# Patient Record
Sex: Female | Born: 1980 | Race: White | Hispanic: No | Smoking: Never smoker
Health system: Southern US, Community
[De-identification: ages and names within clinical notes are randomized; demographics above are authoritative.]

## PROBLEM LIST (undated history)

## (undated) DIAGNOSIS — T7840XA Allergy, unspecified, initial encounter: Secondary | ICD-10-CM

## (undated) DIAGNOSIS — A6000 Herpesviral infection of urogenital system, unspecified: Secondary | ICD-10-CM

## (undated) DIAGNOSIS — F909 Attention-deficit hyperactivity disorder, unspecified type: Secondary | ICD-10-CM

## (undated) HISTORY — DX: Attention-deficit hyperactivity disorder, unspecified type: F90.9

## (undated) HISTORY — DX: Herpesviral infection of urogenital system, unspecified: A60.00

## (undated) HISTORY — DX: Allergy, unspecified, initial encounter: T78.40XA

---

## 2001-07-06 ENCOUNTER — Other Ambulatory Visit: Admission: RE | Admit: 2001-07-06 | Discharge: 2001-07-06 | Payer: Self-pay | Admitting: Obstetrics and Gynecology

## 2004-12-17 ENCOUNTER — Other Ambulatory Visit: Admission: RE | Admit: 2004-12-17 | Discharge: 2004-12-17 | Payer: Self-pay | Admitting: Obstetrics and Gynecology

## 2007-05-28 DIAGNOSIS — A6000 Herpesviral infection of urogenital system, unspecified: Secondary | ICD-10-CM

## 2007-05-28 HISTORY — DX: Herpesviral infection of urogenital system, unspecified: A60.00

## 2009-07-13 ENCOUNTER — Emergency Department (HOSPITAL_BASED_OUTPATIENT_CLINIC_OR_DEPARTMENT_OTHER): Admission: EM | Admit: 2009-07-13 | Discharge: 2009-07-13 | Payer: Self-pay | Admitting: Emergency Medicine

## 2009-07-15 ENCOUNTER — Inpatient Hospital Stay (HOSPITAL_COMMUNITY): Admission: AD | Admit: 2009-07-15 | Discharge: 2009-07-15 | Payer: Self-pay | Admitting: Family Medicine

## 2009-07-21 ENCOUNTER — Inpatient Hospital Stay (HOSPITAL_COMMUNITY): Admission: AD | Admit: 2009-07-21 | Discharge: 2009-07-21 | Payer: Self-pay | Admitting: Obstetrics & Gynecology

## 2009-07-21 ENCOUNTER — Ambulatory Visit: Payer: Self-pay | Admitting: Family Medicine

## 2009-07-23 ENCOUNTER — Emergency Department (HOSPITAL_BASED_OUTPATIENT_CLINIC_OR_DEPARTMENT_OTHER): Admission: EM | Admit: 2009-07-23 | Discharge: 2009-07-23 | Payer: Self-pay | Admitting: Emergency Medicine

## 2009-07-23 ENCOUNTER — Ambulatory Visit: Payer: Self-pay | Admitting: Diagnostic Radiology

## 2010-05-10 ENCOUNTER — Emergency Department (HOSPITAL_BASED_OUTPATIENT_CLINIC_OR_DEPARTMENT_OTHER)
Admission: EM | Admit: 2010-05-10 | Discharge: 2010-05-11 | Payer: Self-pay | Source: Home / Self Care | Admitting: Emergency Medicine

## 2010-08-16 LAB — DIFFERENTIAL
Basophils Relative: 0 % (ref 0–1)
Eosinophils Relative: 0 % (ref 0–5)
Lymphocytes Relative: 16 % (ref 12–46)
Lymphs Abs: 1.4 10*3/uL (ref 0.7–4.0)
Monocytes Absolute: 0.1 10*3/uL (ref 0.1–1.0)
Monocytes Relative: 2 % — ABNORMAL LOW (ref 3–12)
Neutro Abs: 7.5 10*3/uL (ref 1.7–7.7)
Neutrophils Relative %: 83 % — ABNORMAL HIGH (ref 43–77)

## 2010-08-16 LAB — WET PREP, GENITAL

## 2010-08-16 LAB — URINALYSIS, ROUTINE W REFLEX MICROSCOPIC
Bilirubin Urine: NEGATIVE
Glucose, UA: NEGATIVE mg/dL
Glucose, UA: NEGATIVE mg/dL
Hgb urine dipstick: NEGATIVE
Nitrite: NEGATIVE
Specific Gravity, Urine: 1.02 (ref 1.005–1.030)
Specific Gravity, Urine: 1.021 (ref 1.005–1.030)
Urobilinogen, UA: 0.2 mg/dL (ref 0.0–1.0)
Urobilinogen, UA: 0.2 mg/dL (ref 0.0–1.0)
pH: 5.5 (ref 5.0–8.0)
pH: 6 (ref 5.0–8.0)

## 2010-08-16 LAB — CBC
MCHC: 33.1 g/dL (ref 30.0–36.0)
Platelets: 193 10*3/uL (ref 150–400)
RBC: 4.65 MIL/uL (ref 3.87–5.11)
WBC: 9.1 10*3/uL (ref 4.0–10.5)

## 2010-08-16 LAB — GC/CHLAMYDIA PROBE AMP, GENITAL: Chlamydia, DNA Probe: NEGATIVE

## 2010-08-16 LAB — PREGNANCY, URINE: Preg Test, Ur: NEGATIVE

## 2011-04-21 IMAGING — US US PELVIS COMPLETE MODIFY
1 series · 14 of 25 positions shown · non-contrast
Comparison: None.

CLINICAL DATA: Left lower quadrant pelvic pain.

TRANSABDOMINAL AND TRANSVAGINAL ULTRASOUND OF PELVIS
TECHNIQUE: Both transabdominal and transvaginal ultrasound
examinations of the pelvis were performed including evaluation of
the uterus, ovaries, adnexal regions, and pelvic cul-de-sac.

[Series 1: us pelvis complete modify · 0.17mm/px · 14 of 51 slices shown]
[im 1/51]
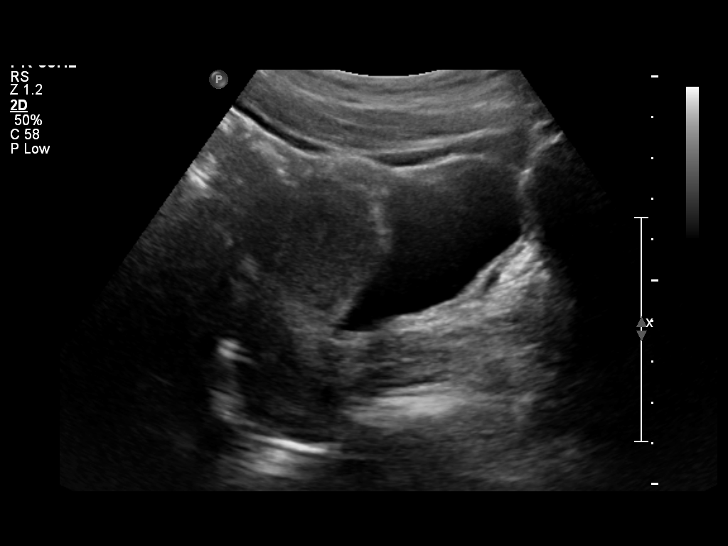
[im 5/51]
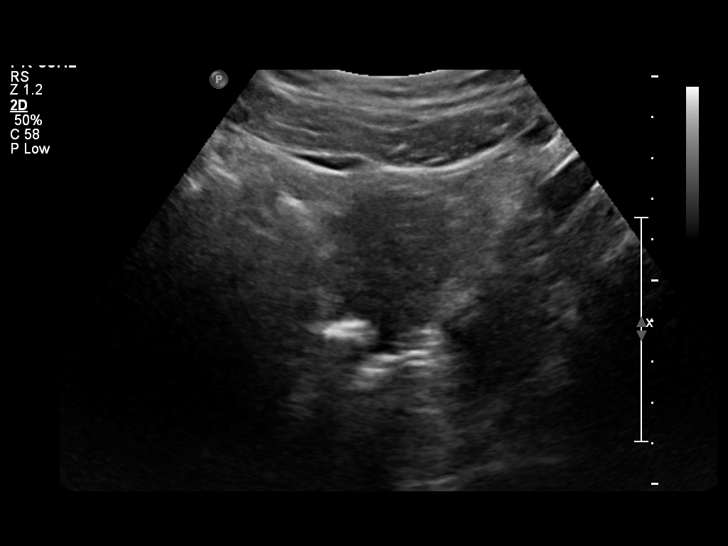
[im 9/51]
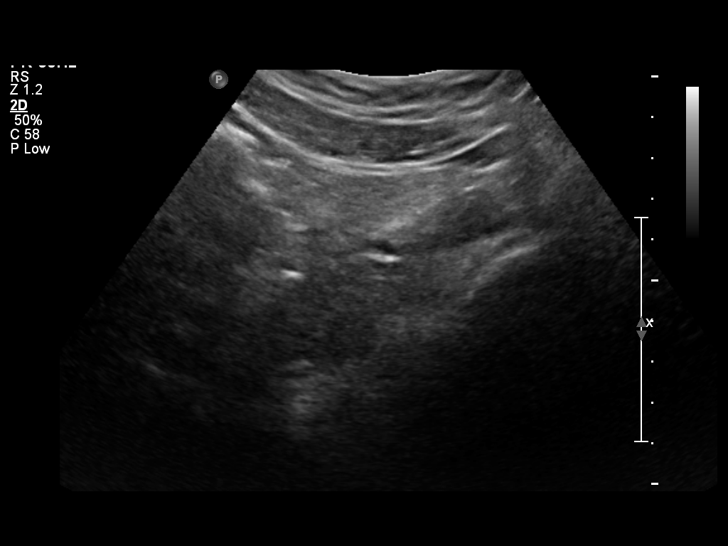
[im 13/51]
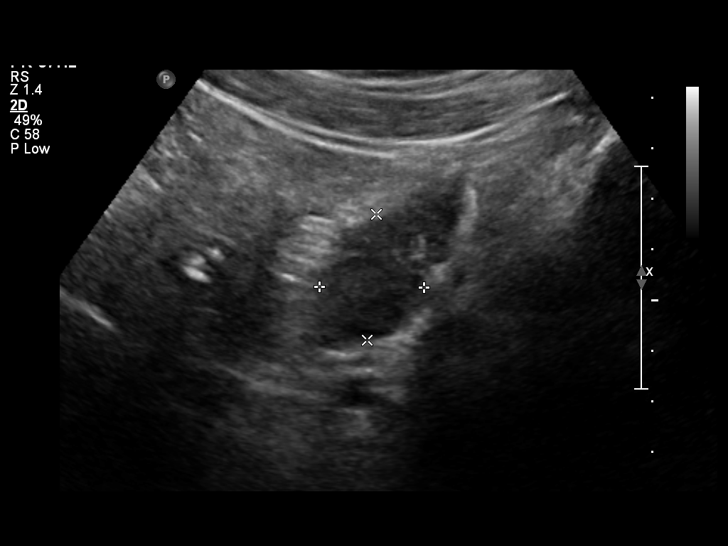
[im 17/51]
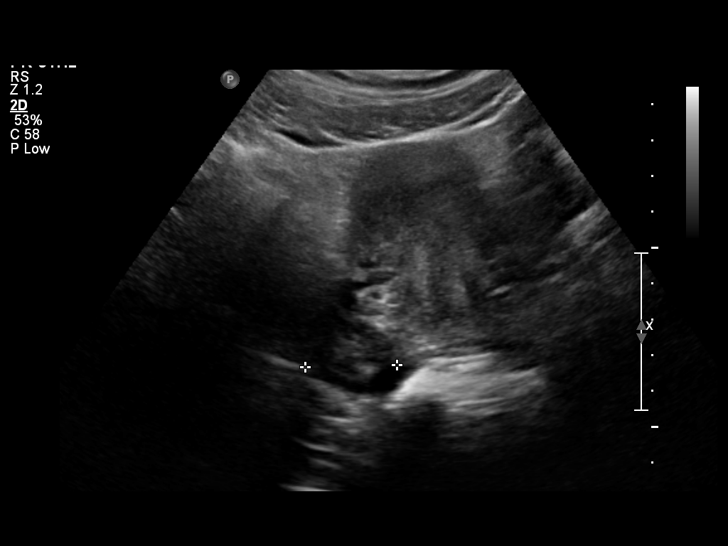
[im 19/51]
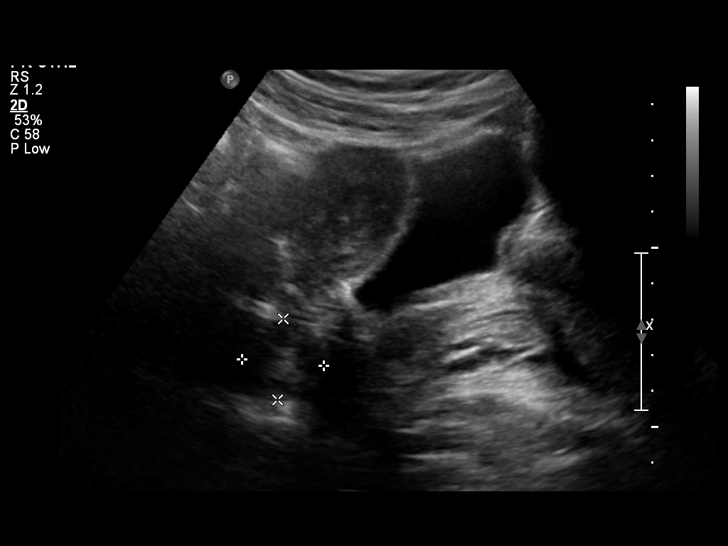
[im 23/51]
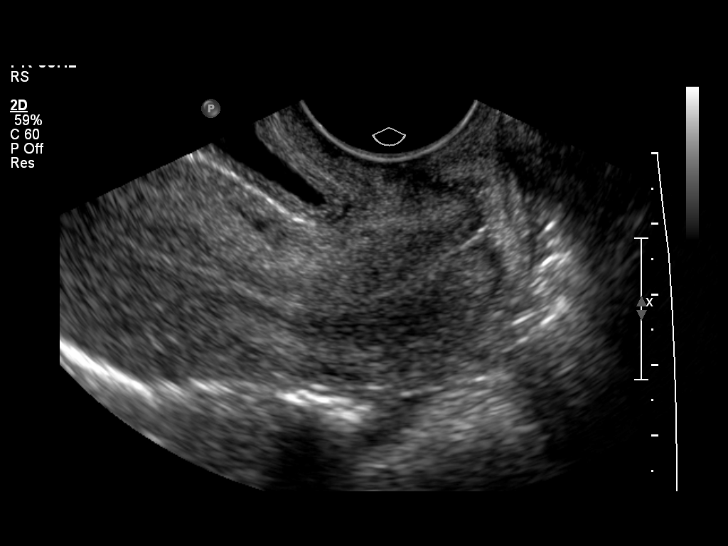
[im 28/51]
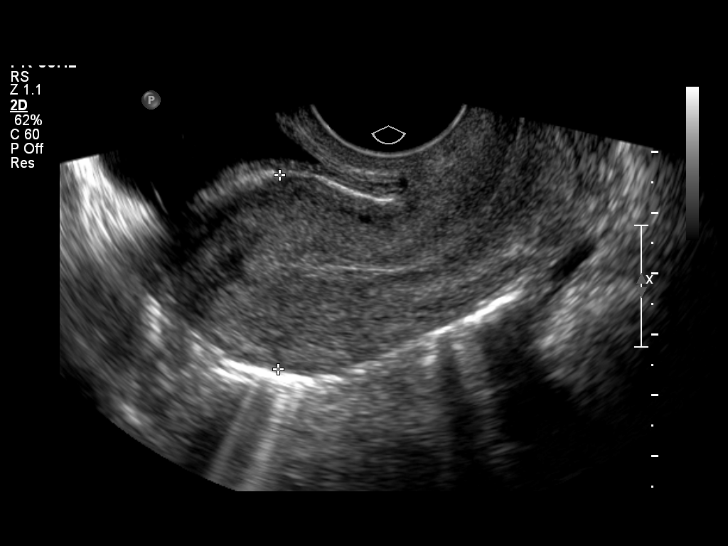
[im 32/51]
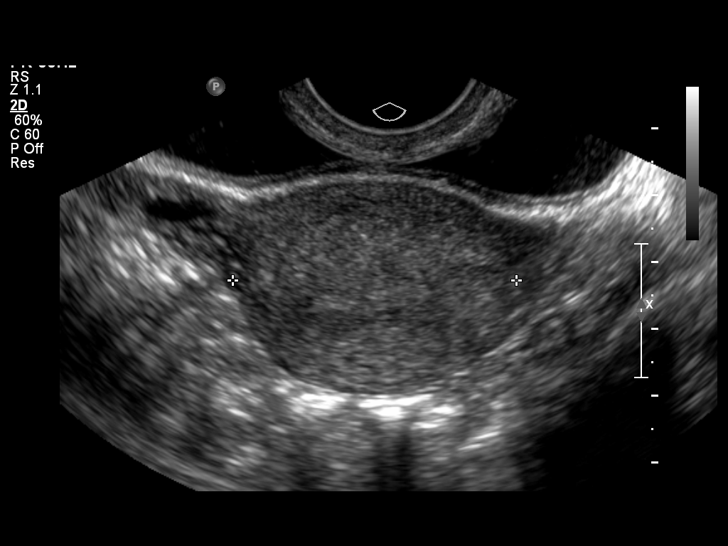
[im 34/51]
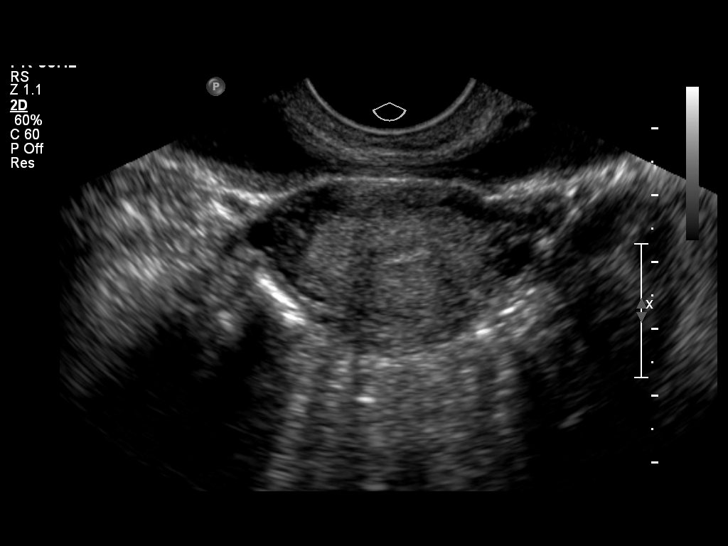
[im 38/51]
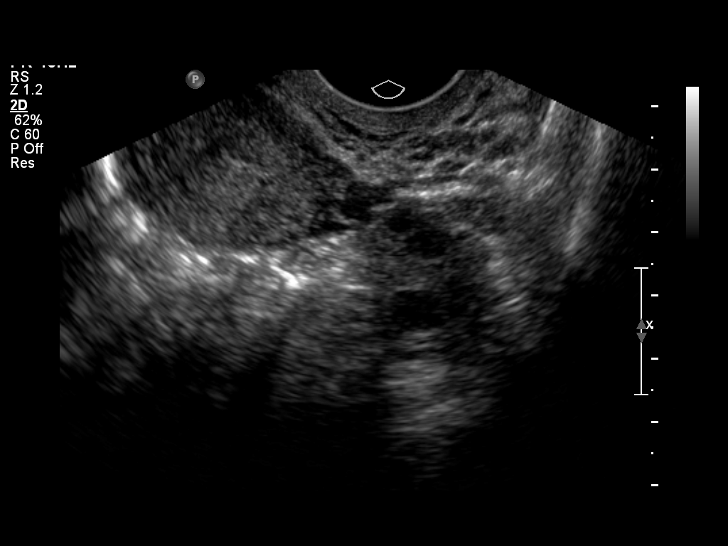
[im 42/51]
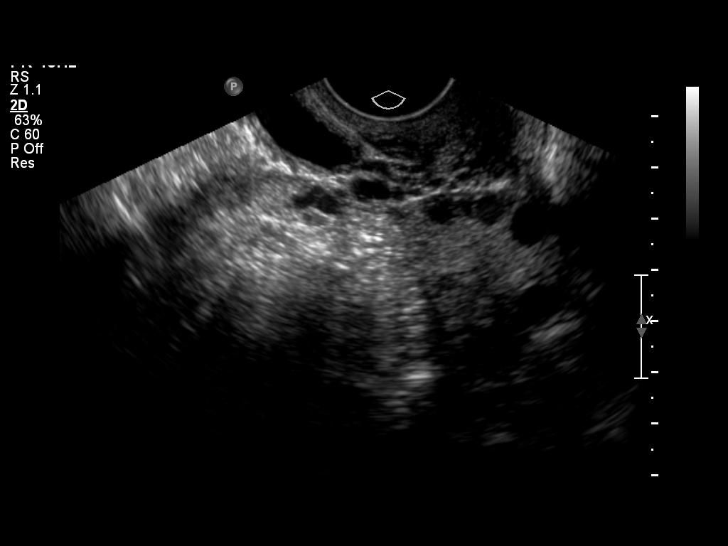
[im 46/51]
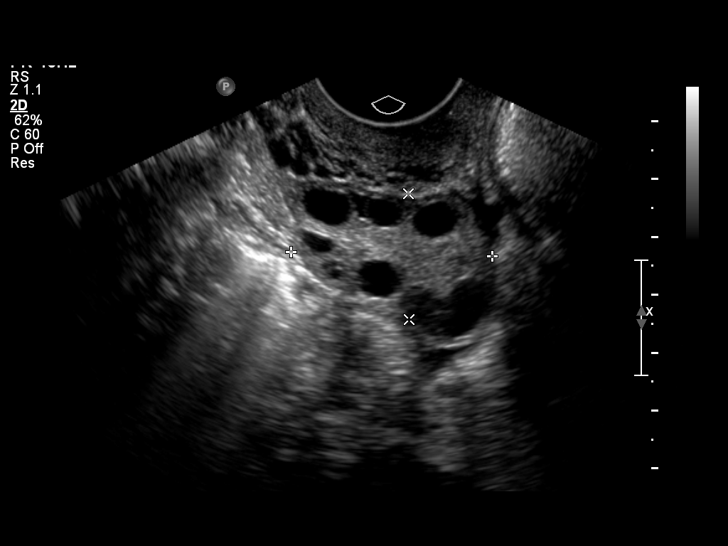
[im 51/51]
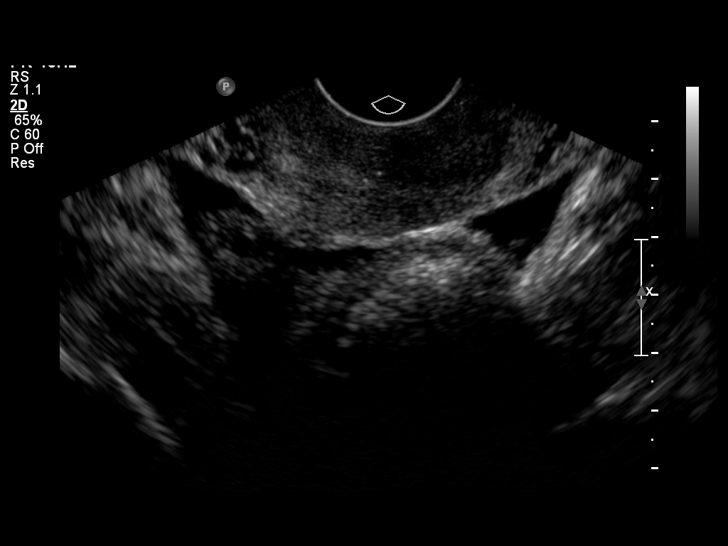

[14 of 25 positions shown; findings below may reference images not displayed]

FINDINGS: Uterus measures 7.2 x 4.2 x 3.2 cm.  Anteverted, anteflexed.
Unremarkable.

Endometrium 3 mm, uniformly echogenic.

Right Ovary measures 3.5 x 3.2 x 2.2 cm.  Unremarkable.

Left Ovary measures 3.5 x 2.4 x 1.9 cm.  Unremarkable.

Other Findings:  A small amount of free fluid is noted in the cul-
de-sac.
IMPRESSION: Normal exam.

## 2011-07-07 ENCOUNTER — Encounter (HOSPITAL_BASED_OUTPATIENT_CLINIC_OR_DEPARTMENT_OTHER): Payer: Self-pay | Admitting: Emergency Medicine

## 2011-07-07 ENCOUNTER — Emergency Department (HOSPITAL_BASED_OUTPATIENT_CLINIC_OR_DEPARTMENT_OTHER)
Admission: EM | Admit: 2011-07-07 | Discharge: 2011-07-07 | Disposition: A | Discharge status: Home / Self Care | Payer: BC Managed Care – PPO | Attending: Emergency Medicine | Admitting: Emergency Medicine

## 2011-07-07 DIAGNOSIS — R300 Dysuria: Secondary | ICD-10-CM | POA: Insufficient documentation

## 2011-07-07 DIAGNOSIS — N390 Urinary tract infection, site not specified: Secondary | ICD-10-CM

## 2011-07-07 DIAGNOSIS — A6000 Herpesviral infection of urogenital system, unspecified: Secondary | ICD-10-CM | POA: Insufficient documentation

## 2011-07-07 LAB — URINALYSIS, ROUTINE W REFLEX MICROSCOPIC
Bilirubin Urine: NEGATIVE
Glucose, UA: NEGATIVE mg/dL
Nitrite: NEGATIVE
Specific Gravity, Urine: 1.019 (ref 1.005–1.030)
pH: 6 (ref 5.0–8.0)

## 2011-07-07 LAB — PREGNANCY, URINE: Preg Test, Ur: NEGATIVE

## 2011-07-07 MED ORDER — VALACYCLOVIR HCL 500 MG PO TABS: 500.0000 mg | ORAL_TABLET | Freq: Two times a day (BID) | ORAL | Status: AC

## 2011-07-07 MED ORDER — PHENAZOPYRIDINE HCL 200 MG PO TABS: 200.0000 mg | ORAL_TABLET | Freq: Three times a day (TID) | ORAL | Status: AC

## 2011-07-07 MED ORDER — NITROFURANTOIN MONOHYD MACRO 100 MG PO CAPS: 100.0000 mg | ORAL_CAPSULE | Freq: Two times a day (BID) | ORAL | Status: AC

## 2011-07-07 MED ORDER — FLUCONAZOLE 150 MG PO TABS: 150.0000 mg | ORAL_TABLET | Freq: Once | ORAL | Status: AC

## 2011-07-07 NOTE — ED Notes (Signed)
Pt c/o dysuria that started today; had RT lower back pain 2-3 days ago, which has resolved

## 2011-07-07 NOTE — ED Provider Notes (Signed)
History     CSN: [id number]  Arrival date & time 07/07/11  0845   First MD Initiated Contact with Patient 07/07/11 1020      Chief Complaint  Patient presents with  . Dysuria    (Consider location/radiation/quality/duration/timing/severity/associated sxs/prior treatment) Patient is a 31 y.o. female presenting with dysuria. The history is provided by the patient.  Dysuria   She noticed onset this morning of dysuria along with urinary urgency, frequency, and tenesmus. She denies abdominal pain or back pain. She denies nausea, vomiting, diarrhea. She denies fever, chills, sweats. 3 days ago, she had pain in the right flank which has resolved. She also, incidentally, has noted a few blisters typical of a flare up of her genital herpes. She has had urinary infections in the past. Nothing is making current symptoms better nothing is making them worse. Symptoms are severe.  History reviewed. No pertinent past medical history.  History reviewed. No pertinent past surgical history.  No family history on file.  History  Substance Use Topics  . Smoking status: Never Smoker   . Smokeless tobacco: Not on file  . Alcohol Use: No    OB History    Grav Para Term Preterm Abortions TAB SAB Ect Mult Living                  Review of Systems  Genitourinary: Positive for dysuria.  All other systems reviewed and are negative.    Allergies  Review of patient's allergies indicates no known allergies.  Home Medications  No current outpatient prescriptions on file.  BP 113/66  Pulse 84  Temp(Src) 98 F (36.7 C) (Oral)  Resp 16  Ht 5\' 5"  (1.651 m)  Wt 143 lb (64.864 kg)  BMI 23.80 kg/m2  SpO2 99%  LMP 06/12/2011  Physical Exam  Nursing note and vitals reviewed.  31 year old female who is resting comfortably and in no acute distress. Vital signs are normal. Head is normocephalic and atraumatic. PERRLA, EOMI. Oropharynx is clear. Neck is nontender and supple. Back is nontender.  There's no CVA tenderness. Lungs are clear without rales, wheezes, or rhonchi. Heart has regular rate and rhythm without murmur. Abdomen is soft, flat, nontender without masses or hepatosplenomegaly. Extremities have no cyanosis or edema. Neurologic: Mental status is normal, cranial nerves are intact, there no focal motor or sensory deficits.  ED Course  Procedures (including critical care time)  Results for orders placed during the hospital encounter of 07/07/11  PREGNANCY, URINE      Component Value Range   Preg Test, Ur NEGATIVE  NEGATIVE   URINALYSIS, ROUTINE W REFLEX MICROSCOPIC      Component Value Range   Color, Urine YELLOW  YELLOW    APPearance TURBID (*) CLEAR    Specific Gravity, Urine 1.019  1.005 - 1.030    pH 6.0  5.0 - 8.0    Glucose, UA NEGATIVE  NEGATIVE (mg/dL)   Hgb urine dipstick LARGE (*) NEGATIVE    Bilirubin Urine NEGATIVE  NEGATIVE    Ketones, ur NEGATIVE  NEGATIVE (mg/dL)   Protein, ur 30 (*) NEGATIVE (mg/dL)   Urobilinogen, UA 0.2  0.0 - 1.0 (mg/dL)   Nitrite NEGATIVE  NEGATIVE    Leukocytes, UA LARGE (*) NEGATIVE   URINE MICROSCOPIC-ADD ON      Component Value Range   Squamous Epithelial / LPF FEW (*) RARE    WBC, UA TOO NUMEROUS TO COUNT  <3 (WBC/hpf)   RBC / HPF 21-50  <3 (RBC/hpf)  Bacteria, UA MANY (*) RARE    Urine-Other RARE YEAST     No results found.    1. Urinary tract infection   2. Genital herpes       MDM  Clinical episode of acute cystitis. I am concerned about the yeast that are present in the urine, however these are likely related to vaginal contamination. She does not had any recent antibiotics. She does give a history of prior vaginal yeast infections. She will be discharged with prescriptions for Macrobid, Diflucan, perineum, and Valtrex.        Dione Booze, MD 07/07/11 1043

## 2011-07-09 LAB — URINE CULTURE

## 2011-07-10 NOTE — ED Notes (Signed)
Treated per protocol; Sensitive to same °

## 2012-04-09 ENCOUNTER — Ambulatory Visit (INDEPENDENT_AMBULATORY_CARE_PROVIDER_SITE_OTHER): Payer: BC Managed Care – PPO | Admitting: Family Medicine

## 2012-04-09 ENCOUNTER — Encounter: Payer: Self-pay | Admitting: Family Medicine

## 2012-04-09 VITALS — BP 122/78 | HR 80 | Temp 98.2°F | Ht 66.0 in | Wt 146.0 lb

## 2012-04-09 DIAGNOSIS — Z0000 Encounter for general adult medical examination without abnormal findings: Secondary | ICD-10-CM | POA: Insufficient documentation

## 2012-04-09 DIAGNOSIS — Z23 Encounter for immunization: Secondary | ICD-10-CM

## 2012-04-09 DIAGNOSIS — F909 Attention-deficit hyperactivity disorder, unspecified type: Secondary | ICD-10-CM

## 2012-04-09 MED ORDER — VALACYCLOVIR HCL 1 G PO TABS: 1000.0000 mg | ORAL_TABLET | Freq: Every day | ORAL | Status: DC

## 2012-04-09 MED ORDER — AMPHETAMINE-DEXTROAMPHET ER 10 MG PO CP24: 10.0000 mg | ORAL_CAPSULE | ORAL | Status: DC

## 2012-04-09 NOTE — Progress Notes (Signed)
  Subjective:    Patient ID: Jane Harper, female    DOB: March 09, 1981, 31 y.o.   MRN: [id number]  HPI New to establish.  Parents are Hopp pts.  Previous MD- [facility]  Herpes- would like to start suppressive tx w/ Valtrex  Review of Systems Patient reports no vision/ hearing changes, adenopathy, fever, weight change,  persistant/recurrent hoarseness , swallowing issues, chest pain, palpitations, edema, persistant/recurrent cough, hemoptysis, dyspnea (rest/exertional/paroxysmal nocturnal), gastrointestinal bleeding (melena, rectal bleeding), abdominal pain, significant heartburn, bowel changes, GU symptoms (dysuria, hematuria, incontinence), Gyn symptoms (abnormal  bleeding, pain),  syncope, focal weakness, memory loss, numbness & tingling, hair/nail changes, abnormal bruising or bleeding, anxiety, or depression.     Objective:   Physical Exam General Appearance:    Alert, cooperative, no distress, appears stated age  Head:    Normocephalic, without obvious abnormality, atraumatic  Eyes:    PERRL, conjunctiva/corneas clear, EOM's intact, fundi    benign, both eyes  Ears:    Normal TM's and external ear canals, both ears  Nose:   Nares normal, septum midline, mucosa normal, no drainage    or sinus tenderness  Throat:   Lips, mucosa, and tongue normal; teeth and gums normal  Neck:   Supple, symmetrical, trachea midline, no adenopathy;    Thyroid: no enlargement/tenderness/nodules  Back:     Symmetric, no curvature, ROM normal, no CVA tenderness  Lungs:     Clear to auscultation bilaterally, respirations unlabored  Chest Wall:    No tenderness or deformity   Heart:    Regular rate and rhythm, S1 and S2 normal, no murmur, rub   or gallop  Breast Exam:    Deferred to GYN  Abdomen:     Soft, non-tender, bowel sounds active all four quadrants,    no masses, no organomegaly  Genitalia:    Deferred to GYN  Rectal:    Extremities:   Extremities normal, atraumatic, no cyanosis or edema    Pulses:   2+ and symmetric all extremities  Skin:   Skin color, texture, turgor normal, no rashes or lesions  Lymph nodes:   Cervical, supraclavicular, and axillary nodes normal  Neurologic:   CNII-XII intact, normal strength, sensation and reflexes    throughout          Assessment & Plan:

## 2012-04-09 NOTE — Patient Instructions (Addendum)
Schedule your pap at your convenience We'll notify you of your lab results You'll need to pick up your Adderall monthly- we can adjust the dose based on your level of symptom control Start the Valtrex daily for suppressive therapy Call with any questions or concerns Welcome!  We're glad to have you!

## 2012-04-10 ENCOUNTER — Encounter: Payer: Self-pay | Admitting: *Deleted

## 2012-04-10 LAB — HEPATIC FUNCTION PANEL
ALT: 13 U/L (ref 0–35)
AST: 18 U/L (ref 0–37)
Alkaline Phosphatase: 48 U/L (ref 39–117)
Total Bilirubin: 0.6 mg/dL (ref 0.3–1.2)
Total Protein: 7.1 g/dL (ref 6.0–8.3)

## 2012-04-10 LAB — CBC WITH DIFFERENTIAL/PLATELET
Basophils Absolute: 0 10*3/uL (ref 0.0–0.1)
Eosinophils Absolute: 0.1 10*3/uL (ref 0.0–0.7)
Hemoglobin: 13.2 g/dL (ref 12.0–15.0)
Lymphocytes Relative: 21.3 % (ref 12.0–46.0)
MCV: 86.4 fl (ref 78.0–100.0)
Monocytes Absolute: 0.3 10*3/uL (ref 0.1–1.0)
Monocytes Relative: 3.8 % (ref 3.0–12.0)
Neutro Abs: 6.1 10*3/uL (ref 1.4–7.7)
Neutrophils Relative %: 73.4 % (ref 43.0–77.0)
RBC: 4.68 Mil/uL (ref 3.87–5.11)
RDW: 13.1 % (ref 11.5–14.6)

## 2012-04-10 LAB — LIPID PANEL
LDL Cholesterol: 87 mg/dL (ref 0–99)
Total CHOL/HDL Ratio: 3
VLDL: 15.4 mg/dL (ref 0.0–40.0)

## 2012-04-10 LAB — BASIC METABOLIC PANEL: GFR: 89.44 mL/min (ref >60.00)

## 2012-04-14 LAB — VITAMIN D 1,25 DIHYDROXY
Vitamin D2 1, 25 (OH)2: <8 pg/mL
Vitamin D3 1, 25 (OH)2: 42 pg/mL

## 2012-04-17 ENCOUNTER — Ambulatory Visit: Payer: BC Managed Care – PPO | Admitting: Family Medicine

## 2012-04-19 ENCOUNTER — Encounter: Payer: Self-pay | Admitting: Family Medicine

## 2012-04-19 DIAGNOSIS — F909 Attention-deficit hyperactivity disorder, unspecified type: Secondary | ICD-10-CM | POA: Insufficient documentation

## 2012-04-19 NOTE — Assessment & Plan Note (Signed)
Pt w/ documented hx of ADHD.  Restart meds.  Adjust dose prn.  Pt expressed understanding and is in agreement w/ plan.

## 2012-04-19 NOTE — Assessment & Plan Note (Signed)
Pt's PE WNL.  Will defer GYN exam due to menses.  Pt to schedule at her convenience.  Check labs.  Anticipatory guidance provided.

## 2012-07-02 ENCOUNTER — Telehealth: Payer: Self-pay | Admitting: Family Medicine

## 2012-07-02 ENCOUNTER — Encounter: Payer: Self-pay | Admitting: Family Medicine

## 2012-07-02 ENCOUNTER — Ambulatory Visit (INDEPENDENT_AMBULATORY_CARE_PROVIDER_SITE_OTHER): Payer: BC Managed Care – PPO | Admitting: Family Medicine

## 2012-07-02 VITALS — BP 112/68 | HR 79 | Temp 98.5°F | Wt 149.4 lb

## 2012-07-02 DIAGNOSIS — J9801 Acute bronchospasm: Secondary | ICD-10-CM

## 2012-07-02 DIAGNOSIS — J309 Allergic rhinitis, unspecified: Secondary | ICD-10-CM

## 2012-07-02 DIAGNOSIS — J302 Other seasonal allergic rhinitis: Secondary | ICD-10-CM

## 2012-07-02 MED ORDER — ALBUTEROL SULFATE HFA 108 (90 BASE) MCG/ACT IN AERS: 2.0000 | INHALATION_SPRAY | Freq: Four times a day (QID) | RESPIRATORY_TRACT | Status: DC | PRN

## 2012-07-02 MED ORDER — VALACYCLOVIR HCL 1 G PO TABS: 1000.0000 mg | ORAL_TABLET | Freq: Every day | ORAL | Status: AC

## 2012-07-02 MED ORDER — AMPHETAMINE-DEXTROAMPHET ER 10 MG PO CP24: 10.0000 mg | ORAL_CAPSULE | ORAL | Status: DC

## 2012-07-02 MED ORDER — BECLOMETHASONE DIPROPIONATE 40 MCG/ACT IN AERS: 2.0000 | INHALATION_SPRAY | Freq: Two times a day (BID) | RESPIRATORY_TRACT | Status: DC

## 2012-07-02 NOTE — Progress Notes (Signed)
  Subjective:     Jane Harper is a 32 y.o. female who presents for evaluation of symptoms of a URI. Symptoms include no  fever, post nasal drip, shortness of breath and wheezing. Onset of symptoms was several days ago, and has been gradually worsening since that time. Treatment to date: antihistamines and cough suppressants.  The following portions of the patient's history were reviewed and updated as appropriate: allergies, current medications, past family history, past medical history, past social history, past surgical history and problem list.  Review of Systems Pertinent items are noted in HPI.   Objective:    BP 112/68  Pulse 79  Temp 98.5 F (36.9 C) (Oral)  Wt 149 lb 6.4 oz (67.767 kg)  SpO2 99% General appearance: alert, cooperative, appears stated age and no distress Ears: normal TM's and external ear canals both ears Nose: Nares normal. Septum midline. Mucosa normal. No drainage or sinus tenderness. Throat: lips, mucosa, and tongue normal; teeth and gums normal Neck: no adenopathy, supple, symmetrical, trachea midline and thyroid not enlarged, symmetric, no tenderness/mass/nodules Lungs: diminished breath sounds bilaterally Heart: S1, S2 normal   Assessment:    allergic rhinitis and bronchospasm   Plan:    Discussed diagnosis and treatment of URI. Suggested symptomatic OTC remedies. Follow up as needed.  qvar and proair Antihistamine daily

## 2012-07-02 NOTE — Telephone Encounter (Signed)
Patient Information:  Caller Name: Jane Harper  Phone: [phone]  Patient: Jane, Harper  Gender: Female  DOB: 1980-11-23  Age: 32 Years  PCP: Lelon Perla.  Pregnant: No  Office Follow Up:  Does the office need to follow up with this patient?: No  Instructions For The Office: N/A   Symptoms  Reason For Call & Symptoms: Calla is calling about throat feeling scratchy and feeling short of breath on and off for past 2 days. Clear runny nose. Hx Bronchitis. Afebrile. No cough.  Reviewed Health History In EMR: Yes  Reviewed Medications In EMR: Yes  Reviewed Allergies In EMR: Yes  Reviewed Surgeries / Procedures: Yes  Date of Onset of Symptoms: 07/01/2012  Treatments Tried: Tylenol Cold Medicine  Treatments Tried Worked: No OB / GYN:  LMP: Unknown  Guideline(s) Used:  Breathing Difficulty  Disposition Per Guideline:   Go to Office Now  Reason For Disposition Reached:   Mild difficulty breathing (e.g., minimal/no SOB at rest, SOB with walking, pulse < 100) of new onset or worse than normal  Advice Given:  General Care Advice for Breathing Difficulty:  Find position of greatest comfort. For most patients the best position is semi-upright (e.g., sitting up in a comfortable chair or lying back against pillows).  Elevate head of bed (e.g., use pillows or place blocks under bed).  Avoid smoke or fume exposure. Increase fluids, Take warm fluids, use humidifier  Call Back If:  Severe difficulty breathing occurs  Fever more than 100.5 F (38.1 C)  You become worse.  Appointment Scheduled:  07/02/2012 14:30:00 Appointment Scheduled Provider:  Lelon Perla

## 2012-07-02 NOTE — Telephone Encounter (Signed)
Dr Laury Axon pt, seen in office today.

## 2012-07-02 NOTE — Patient Instructions (Signed)
Allergies, Generic  Allergies may happen from anything your body is sensitive to. This may be food, medicines, pollens, chemicals, and nearly anything around you in everyday life that produces allergens. An allergen is anything that causes an allergy producing substance. Heredity is often a factor in causing these problems. This means you may have some of the same allergies as your parents.  Food allergies happen in all age groups. Food allergies are some of the most severe and life threatening. Some common food allergies are cow's milk, seafood, eggs, nuts, wheat, and soybeans.  SYMPTOMS    Swelling around the mouth.   An itchy red rash or hives.   Vomiting or diarrhea.   Difficulty breathing.  SEVERE ALLERGIC REACTIONS ARE LIFE-THREATENING.  This reaction is called anaphylaxis. It can cause the mouth and throat to swell and cause difficulty with breathing and swallowing. In severe reactions only a trace amount of food (for example, peanut oil in a salad) may cause death within seconds.  Seasonal allergies occur in all age groups. These are seasonal because they usually occur during the same season every year. They may be a reaction to molds, grass pollens, or tree pollens. Other causes of problems are house dust mite allergens, pet dander, and mold spores. The symptoms often consist of nasal congestion, a runny itchy nose associated with sneezing, and tearing itchy eyes. There is often an associated itching of the mouth and ears. The problems happen when you come in contact with pollens and other allergens. Allergens are the particles in the air that the body reacts to with an allergic reaction. This causes you to release allergic antibodies. Through a chain of events, these eventually cause you to release histamine into the blood stream. Although it is meant to be protective to the body, it is this release that causes your discomfort. This is why you were given anti-histamines to feel better. If you are  unable to pinpoint the offending allergen, it may be determined by skin or blood testing. Allergies cannot be cured but can be controlled with medicine.  Hay fever is a collection of all or some of the seasonal allergy problems. It may often be treated with simple over-the-counter medicine such as diphenhydramine. Take medicine as directed. Do not drink alcohol or drive while taking this medicine. Check with your caregiver or package insert for child dosages.  If these medicines are not effective, there are many new medicines your caregiver can prescribe. Stronger medicine such as nasal spray, eye drops, and corticosteroids may be used if the first things you try do not work well. Other treatments such as immunotherapy or desensitizing injections can be used if all else fails. Follow up with your caregiver if problems continue. These seasonal allergies are usually not life threatening. They are generally more of a nuisance that can often be handled using medicine.  HOME CARE INSTRUCTIONS    If unsure what causes a reaction, keep a diary of foods eaten and symptoms that follow. Avoid foods that cause reactions.   If hives or rash are present:   Take medicine as directed.   You may use an over-the-counter antihistamine (diphenhydramine) for hives and itching as needed.   Apply cold compresses (cloths) to the skin or take baths in cool water. Avoid hot baths or showers. Heat will make a rash and itching worse.   If you are severely allergic:   Following a treatment for a severe reaction, hospitalization is often required for closer follow-up.     Wear a medic-alert bracelet or necklace stating the allergy.   You and your family must learn how to give adrenaline or use an anaphylaxis kit.   If you have had a severe reaction, always carry your anaphylaxis kit or EpiPen with you. Use this medicine as directed by your caregiver if a severe reaction is occurring. Failure to do so could have a fatal outcome.  SEEK  MEDICAL CARE IF:   You suspect a food allergy. Symptoms generally happen within 30 minutes of eating a food.   Your symptoms have not gone away within 2 days or are getting worse.   You develop new symptoms.   You want to retest yourself or your child with a food or drink you think causes an allergic reaction. Never do this if an anaphylactic reaction to that food or drink has happened before. Only do this under the care of a caregiver.  SEEK IMMEDIATE MEDICAL CARE IF:    You have difficulty breathing, are wheezing, or have a tight feeling in your chest or throat.   You have a swollen mouth, or you have hives, swelling, or itching all over your body.   You have had a severe reaction that has responded to your anaphylaxis kit or an EpiPen. These reactions may return when the medicine has worn off. These reactions should be considered life threatening.  MAKE SURE YOU:    Understand these instructions.   Will watch your condition.   Will get help right away if you are not doing well or get worse.  Document Released: 08/06/2002 Document Revised: 08/05/2011 Document Reviewed: 01/11/2008  ExitCare Patient Information 2013 [employer].

## 2012-07-03 ENCOUNTER — Encounter: Payer: Self-pay | Admitting: *Deleted

## 2012-07-03 LAB — ALLERGEN FOOD PROFILE SPECIFIC IGE
Chicken IgE: <0.1 kU/L
Egg White IgE: <0.1 kU/L
Fish Cod: <0.1 kU/L
Milk IgE: <0.1 kU/L
Wheat IgE: <0.1 kU/L

## 2012-07-07 ENCOUNTER — Ambulatory Visit: Payer: BC Managed Care – PPO | Admitting: Family Medicine

## 2012-08-10 HISTORY — PX: NEVUS EXCISION: SHX2090

## 2012-09-04 ENCOUNTER — Telehealth: Payer: Self-pay | Admitting: *Deleted

## 2012-09-04 NOTE — Telephone Encounter (Signed)
Attempted to speak with pt concerning allergy testing, lmovm stating she is highly allergic to cats and leaf fungus. Advised to avoid allergens if possible and to use OTC antihistamines prn.

## 2012-12-21 ENCOUNTER — Other Ambulatory Visit (HOSPITAL_COMMUNITY)
Admission: RE | Admit: 2012-12-21 | Discharge: 2012-12-21 | Disposition: A | Discharge status: Home / Self Care | Payer: BC Managed Care – PPO | Source: Ambulatory Visit | Attending: Internal Medicine | Admitting: Internal Medicine

## 2012-12-21 DIAGNOSIS — Z01419 Encounter for gynecological examination (general) (routine) without abnormal findings: Secondary | ICD-10-CM | POA: Insufficient documentation

## 2012-12-21 DIAGNOSIS — Z1151 Encounter for screening for human papillomavirus (HPV): Secondary | ICD-10-CM | POA: Insufficient documentation

## 2013-04-19 ENCOUNTER — Encounter: Payer: Self-pay | Admitting: Emergency Medicine

## 2013-04-19 ENCOUNTER — Ambulatory Visit: Payer: BC Managed Care – PPO | Admitting: Emergency Medicine

## 2013-04-19 VITALS — BP 104/66 | HR 88 | Temp 98.2°F | Resp 18 | Wt 150.0 lb

## 2013-04-19 DIAGNOSIS — N390 Urinary tract infection, site not specified: Secondary | ICD-10-CM

## 2013-04-19 DIAGNOSIS — F988 Other specified behavioral and emotional disorders with onset usually occurring in childhood and adolescence: Secondary | ICD-10-CM

## 2013-04-19 DIAGNOSIS — R109 Unspecified abdominal pain: Secondary | ICD-10-CM

## 2013-04-19 LAB — BASIC METABOLIC PANEL WITH GFR
CO2: 26 mEq/L (ref 19–32)
Calcium: 9.8 mg/dL (ref 8.4–10.5)
Chloride: 103 mEq/L (ref 96–112)
Creat: 0.96 mg/dL (ref 0.50–1.10)
GFR, Est Non African American: 79 mL/min
Glucose, Bld: 65 mg/dL — ABNORMAL LOW (ref 70–99)
Sodium: 139 mEq/L (ref 135–145)

## 2013-04-19 LAB — CBC WITH DIFFERENTIAL/PLATELET
Basophils Absolute: 0 10*3/uL (ref 0.0–0.1)
Basophils Relative: 0 % (ref 0–1)
Eosinophils Relative: 1 % (ref 0–5)
Lymphocytes Relative: 26 % (ref 12–46)
Lymphs Abs: 1.7 10*3/uL (ref 0.7–4.0)
Neutrophils Relative %: 65 % (ref 43–77)
Platelets: 241 10*3/uL (ref 150–400)
RBC: 4.98 MIL/uL (ref 3.87–5.11)
RDW: 13.5 % (ref 11.5–15.5)
WBC: 6.8 10*3/uL (ref 4.0–10.5)

## 2013-04-19 MED ORDER — NITROFURANTOIN MONOHYD MACRO 100 MG PO CAPS: 100.0000 mg | ORAL_CAPSULE | Freq: Two times a day (BID) | ORAL | Status: AC

## 2013-04-19 MED ORDER — AMPHETAMINE-DEXTROAMPHET ER 10 MG PO CP24: 10.0000 mg | ORAL_CAPSULE | ORAL | Status: AC

## 2013-04-19 NOTE — Patient Instructions (Signed)
Kidney Stones °Kidney stones (urolithiasis) are solid masses that form inside your kidneys. The intense pain is caused by the stone moving through the kidney, ureter, bladder, and urethra (urinary tract). When the stone moves, the ureter starts to spasm around the stone. The stone is usually passed in your pee (urine).  °HOME CARE °· Drink enough fluids to keep your pee clear or pale yellow. This helps to get the stone out. °· Strain all pee through the provided strainer. Do not pee without peeing through the strainer, not even once. If you pee the stone out, catch it in the strainer. The stone may be as small as a grain of salt. Take this to your doctor. This will help your doctor figure out what you can do to try to prevent more kidney stones. °· Only take medicine as told by your doctor. °· Follow up with your doctor as told. °· Get follow-up X-rays as told by your doctor. °GET HELP IF: °You have pain that gets worse even if you have been taking pain medicine. °GET HELP RIGHT AWAY IF:  °· Your pain does not get better with medicine. °· You have a fever or shaking chills. °· Your pain increases and gets worse over 18 hours. °· You have new belly (abdominal) pain. °· You feel faint or pass out. °· You are unable to pee. °MAKE SURE YOU:  °· Understand these instructions. °· Will watch your condition. °· Will get help right away if you are not doing well or get worse. °Document Released: 10/30/2007 Document Revised: 01/13/2013 Document Reviewed: 10/14/2012 °[employer]® Patient Information ©2014 [employer]. ° °Urinary Tract Infection °A urinary tract infection (UTI) can occur any place along the urinary tract. The tract includes the kidneys, ureters, bladder, and urethra. A type of germ called bacteria often causes a UTI. UTIs are often helped with antibiotic medicine.  °HOME CARE  °· If given, take antibiotics as told by your doctor. Finish them even if you start to feel better. °· Drink enough fluids to keep your  pee (urine) clear or pale yellow. °· Avoid tea, drinks with caffeine, and bubbly (carbonated) drinks. °· Pee often. Avoid holding your pee in for a long time. °· Pee before and after having sex (intercourse). °· Wipe from front to back after you poop (bowel movement) if you are a woman. Use each tissue only once. °GET HELP RIGHT AWAY IF:  °· You have back pain. °· You have lower belly (abdominal) pain. °· You have chills. °· You feel sick to your stomach (nauseous). °· You throw up (vomit). °· Your burning or discomfort with peeing does not go away. °· You have a fever. °· Your symptoms are not better in 3 days. °MAKE SURE YOU:  °· Understand these instructions. °· Will watch your condition. °· Will get help right away if you are not doing well or get worse. °Document Released: 10/30/2007 Document Revised: 02/05/2012 Document Reviewed: 12/12/2011 °[employer]® Patient Information ©2014 [employer]. ° °

## 2013-04-19 NOTE — Progress Notes (Signed)
  Subjective:    Patient ID: Jane Harper, female    DOB: 1981/02/27, 32 y.o.   MRN: [id number]  HPI Comments: Patient was seen at UC several days ago with + culture for UTI and started on Cipro AD. She initially had improvement with symptoms but has noticed increased left flank pain over last day or so. She has increased her soda intake instead of water, and increased  Sexual activity with spouse. Denies concerns for STDs, discharge, fever or blood. She does not have a history of kidney stones.  Back Pain    Current Outpatient Prescriptions on File Prior to Visit  Medication Sig Dispense Refill  . albuterol (PROAIR HFA) 108 (90 BASE) MCG/ACT inhaler Inhale 2 puffs into the lungs every 6 (six) hours as needed for wheezing.      . beclomethasone (QVAR) 40 MCG/ACT inhaler Inhale 2 puffs into the lungs 2 (two) times daily.  1 Inhaler  12  . valACYclovir (VALTREX) 1000 MG tablet Take 1 tablet (1,000 mg total) by mouth daily.  30 tablet  6   No current facility-administered medications on file prior to visit.  ALSO ON ADDERALL XR 10mg  qd  Review of patient's allergies indicates no known allergies.  Past Medical History  Diagnosis Date  . Herpes genitalia 2009  . ADHD (attention deficit hyperactivity disorder)     Review of Systems  Musculoskeletal: Positive for back pain.  All other systems reviewed and are negative.    BP 104/66  Pulse 88  Temp(Src) 98.2 F (36.8 C) (Temporal)  Resp 18  Wt 150 lb (68.04 kg)  LMP 03/26/2013     Objective:   Physical Exam  Nursing note and vitals reviewed. Constitutional: She appears well-developed and well-nourished.  HENT:  Head: Normocephalic.  Eyes: Pupils are equal, round, and reactive to light.  Neck: Normal range of motion.  Cardiovascular: Normal rate, regular rhythm, normal heart sounds and intact distal pulses.   Pulmonary/Chest: Effort normal and breath sounds normal.  Abdominal: Soft. Bowel sounds are normal. She exhibits no  mass. There is tenderness. There is no rebound and no guarding.  suprapubic  Musculoskeletal: Normal range of motion.  Neurological: She is alert.  Skin: Skin is warm and dry.  Psychiatric: Judgment normal.          Assessment & Plan:  Concern for UTi- add Macrobid AD, Incease H2O, decrease soda. Hygiene advised w/c if symptoms increase. Check labs

## 2013-04-20 LAB — URINALYSIS, ROUTINE W REFLEX MICROSCOPIC
Bilirubin Urine: NEGATIVE
Leukocytes, UA: NEGATIVE
Nitrite: NEGATIVE
Protein, ur: NEGATIVE mg/dL
Specific Gravity, Urine: 1.02 (ref 1.005–1.030)
Urobilinogen, UA: 0.2 mg/dL (ref 0.0–1.0)
pH: 6 (ref 5.0–8.0)

## 2013-04-20 LAB — URINE CULTURE
Colony Count: NO GROWTH
Organism ID, Bacteria: NO GROWTH

## 2013-04-21 ENCOUNTER — Telehealth: Payer: Self-pay | Admitting: *Deleted

## 2013-04-21 NOTE — Telephone Encounter (Signed)
Spoke with patient about recent lab results and instructions. 

## 2013-04-21 NOTE — Telephone Encounter (Signed)
Message copied by Nicholaus Corolla A on Wed Apr 21, 2013 10:03 AM ------      Message from: [place], [place] R      Created: Wed Apr 21, 2013  6:41 AM       No UTI but with symptoms continue antibiotic AD. F/u if no improvement ------

## 2013-04-26 ENCOUNTER — Ambulatory Visit
Admission: RE | Admit: 2013-04-26 | Discharge: 2013-04-26 | Disposition: A | Discharge status: Home / Self Care | Payer: BC Managed Care – PPO | Source: Ambulatory Visit | Attending: Emergency Medicine | Admitting: Emergency Medicine

## 2013-04-26 ENCOUNTER — Other Ambulatory Visit: Payer: Self-pay | Admitting: Emergency Medicine

## 2013-04-26 DIAGNOSIS — R319 Hematuria, unspecified: Secondary | ICD-10-CM

## 2013-04-26 LAB — PREGNANCY, URINE: Preg Test, Ur: NEGATIVE

## 2013-04-26 MED ORDER — CIPROFLOXACIN HCL 500 MG PO TABS: 500.0000 mg | ORAL_TABLET | Freq: Two times a day (BID) | ORAL | Status: DC

## 2013-04-26 MED ORDER — CIPROFLOXACIN HCL 500 MG PO TABS: 500.0000 mg | ORAL_TABLET | Freq: Two times a day (BID) | ORAL | Status: AC

## 2013-04-27 ENCOUNTER — Encounter (HOSPITAL_BASED_OUTPATIENT_CLINIC_OR_DEPARTMENT_OTHER): Payer: Self-pay | Admitting: Emergency Medicine

## 2013-04-27 ENCOUNTER — Emergency Department (HOSPITAL_BASED_OUTPATIENT_CLINIC_OR_DEPARTMENT_OTHER)
Admission: EM | Admit: 2013-04-27 | Discharge: 2013-04-27 | Disposition: A | Discharge status: Home / Self Care | Payer: BC Managed Care – PPO | Attending: Emergency Medicine | Admitting: Emergency Medicine

## 2013-04-27 ENCOUNTER — Telehealth: Payer: Self-pay | Admitting: *Deleted

## 2013-04-27 DIAGNOSIS — Z792 Long term (current) use of antibiotics: Secondary | ICD-10-CM | POA: Insufficient documentation

## 2013-04-27 DIAGNOSIS — Z3202 Encounter for pregnancy test, result negative: Secondary | ICD-10-CM | POA: Insufficient documentation

## 2013-04-27 DIAGNOSIS — Z79899 Other long term (current) drug therapy: Secondary | ICD-10-CM | POA: Insufficient documentation

## 2013-04-27 DIAGNOSIS — F909 Attention-deficit hyperactivity disorder, unspecified type: Secondary | ICD-10-CM | POA: Insufficient documentation

## 2013-04-27 DIAGNOSIS — IMO0002 Reserved for concepts with insufficient information to code with codable children: Secondary | ICD-10-CM | POA: Insufficient documentation

## 2013-04-27 DIAGNOSIS — Z8619 Personal history of other infectious and parasitic diseases: Secondary | ICD-10-CM | POA: Insufficient documentation

## 2013-04-27 DIAGNOSIS — M549 Dorsalgia, unspecified: Secondary | ICD-10-CM

## 2013-04-27 DIAGNOSIS — Z87440 Personal history of urinary (tract) infections: Secondary | ICD-10-CM | POA: Insufficient documentation

## 2013-04-27 LAB — URINALYSIS, ROUTINE W REFLEX MICROSCOPIC
Glucose, UA: NEGATIVE mg/dL
Ketones, ur: NEGATIVE mg/dL
Leukocytes, UA: NEGATIVE
Nitrite: NEGATIVE
Protein, ur: NEGATIVE mg/dL
Urobilinogen, UA: 0.2 mg/dL (ref 0.0–1.0)
pH: 6 (ref 5.0–8.0)

## 2013-04-27 NOTE — ED Notes (Signed)
pa at bedside. 

## 2013-04-27 NOTE — Telephone Encounter (Signed)
PT aware of ct results & instructions. Also, scheduled a f/u UA in to recheck blood in urine.

## 2013-04-27 NOTE — ED Provider Notes (Signed)
CSN: [id number]     Arrival date & time 04/27/13  1749 History   First MD Initiated Contact with Patient 04/27/13 1816     Chief Complaint  Patient presents with  . Flank Pain   (Consider location/radiation/quality/duration/timing/severity/associated sxs/prior Treatment) Patient is a 32 y.o. female presenting with flank pain. The history is provided by the patient. No language interpreter was used.  Flank Pain This is a new problem. The current episode started 1 to 4 weeks ago. The problem occurs constantly. The problem has been unchanged. Associated symptoms comments: Left flank . Nothing aggravates the symptoms. She has tried nothing for the symptoms. The treatment provided mild relief.   Pt has had flank pain  Past Medical History  Diagnosis Date  . Herpes genitalia 2009  . ADHD (attention deficit hyperactivity disorder)    History reviewed. No pertinent past surgical history. Family History  Problem Relation Age of Onset  . Hypertension Mother   . Hypertension Father   . Hypertension Maternal Grandmother   . Diabetes Maternal Uncle   . Heart attack Maternal Grandfather   . Lung cancer Maternal Grandfather    History  Substance Use Topics  . Smoking status: Never Smoker   . Smokeless tobacco: Not on file  . Alcohol Use: No   OB History   Grav Para Term Preterm Abortions TAB SAB Ect Mult Living                 Review of Systems  Genitourinary: Positive for flank pain.  All other systems reviewed and are negative.    Allergies  Review of patient's allergies indicates no known allergies.  Home Medications   Current Outpatient Rx  Name  Route  Sig  Dispense  Refill  . albuterol (PROAIR HFA) 108 (90 BASE) MCG/ACT inhaler   Inhalation   Inhale 2 puffs into the lungs every 6 (six) hours as needed for wheezing.         Marland Kitchen amphetamine-dextroamphetamine (ADDERALL XR) 10 MG 24 hr capsule   Oral   Take 1 capsule (10 mg total) by mouth every morning.   30 capsule  0   . beclomethasone (QVAR) 40 MCG/ACT inhaler   Inhalation   Inhale 2 puffs into the lungs 2 (two) times daily.   1 Inhaler   12   . ciprofloxacin (CIPRO) 500 MG tablet   Oral   Take 1 tablet (500 mg total) by mouth 2 (two) times daily.   14 tablet   0   . valACYclovir (VALTREX) 1000 MG tablet   Oral   Take 1 tablet (1,000 mg total) by mouth daily.   30 tablet   6    BP 114/72  Pulse 78  Temp(Src) 98.6 F (37 C) (Oral)  Resp 16  Ht 5\' 5"  (1.651 m)  Wt 147 lb (66.679 kg)  BMI 24.46 kg/m2  SpO2 96%  LMP 03/26/2013 Physical Exam  Nursing note and vitals reviewed. Constitutional: She appears well-developed and well-nourished.  HENT:  Head: Normocephalic.  Right Ear: External ear normal.  Left Ear: External ear normal.  Nose: Nose normal.  Mouth/Throat: Oropharynx is clear and moist.  Eyes: Conjunctivae are normal. Pupils are equal, round, and reactive to light.  Neck: Normal range of motion.  Cardiovascular: Normal rate.   Pulmonary/Chest: Effort normal.  Abdominal: Soft.  Musculoskeletal: Normal range of motion.  Neurological: She is alert.  Skin: Skin is warm.  Psychiatric: She has a normal mood and affect.    ED  Course  Procedures (including critical care time) Labs Review Labs Reviewed  URINALYSIS, ROUTINE W REFLEX MICROSCOPIC  PREGNANCY, URINE   Imaging Review Ct Abdomen Pelvis Wo Contrast  04/26/2013   CLINICAL DATA:  Left lower quadrant pain.  Hematuria.  EXAM: CT ABDOMEN AND PELVIS WITHOUT CONTRAST  TECHNIQUE: Multidetector CT imaging of the abdomen and pelvis was performed following the standard protocol without intravenous contrast.  COMPARISON:  None.  FINDINGS: Lung bases are clear.  No pericardial fluid.  No focal hepatic lesions on this non contrast exam. The gallbladder, pancreas, spleen, and adrenal glands are normal. There is no nephrolithiasis or ureterolithiasis. No obstructive uropathy evident. There is a rounded 3 mm calcification in the  left pelvis which is felt to represent vascular calcification (image 71).  The stomach, small bowel, appendix, cecum normal. The colon and rectosigmoid colon are normal.  Smaller free fluid in the right pelvis. Uterus and ovaries are grossly normal on this noncontrast exam. No aggressive osseous lesion.  IMPRESSION: 1. No nephrolithiasis or obstructive uropathy. 2. No clear evidence of ureterolithiasis. 3. Normal appendix. 4. Small amount of free fluid in the right pelvis is likely physiologic.   Electronically Signed   By: Genevive Bi M.D.   On: 04/26/2013 17:06    EKG Interpretation   None      Results for orders placed during the hospital encounter of 04/27/13  URINALYSIS, ROUTINE W REFLEX MICROSCOPIC      Result Value Range   Color, Urine YELLOW  YELLOW   APPearance CLEAR  CLEAR   Specific Gravity, Urine 1.016  1.005 - 1.030   pH 6.0  5.0 - 8.0   Glucose, UA NEGATIVE  NEGATIVE mg/dL   Hgb urine dipstick NEGATIVE  NEGATIVE   Bilirubin Urine NEGATIVE  NEGATIVE   Ketones, ur NEGATIVE  NEGATIVE mg/dL   Protein, ur NEGATIVE  NEGATIVE mg/dL   Urobilinogen, UA 0.2  0.0 - 1.0 mg/dL   Nitrite NEGATIVE  NEGATIVE   Leukocytes, UA NEGATIVE  NEGATIVE  PREGNANCY, URINE      Result Value Range   Preg Test, Ur NEGATIVE  NEGATIVE   Ct Abdomen Pelvis Wo Contrast  04/26/2013   CLINICAL DATA:  Left lower quadrant pain.  Hematuria.  EXAM: CT ABDOMEN AND PELVIS WITHOUT CONTRAST  TECHNIQUE: Multidetector CT imaging of the abdomen and pelvis was performed following the standard protocol without intravenous contrast.  COMPARISON:  None.  FINDINGS: Lung bases are clear.  No pericardial fluid.  No focal hepatic lesions on this non contrast exam. The gallbladder, pancreas, spleen, and adrenal glands are normal. There is no nephrolithiasis or ureterolithiasis. No obstructive uropathy evident. There is a rounded 3 mm calcification in the left pelvis which is felt to represent vascular calcification (image  71).  The stomach, small bowel, appendix, cecum normal. The colon and rectosigmoid colon are normal.  Smaller free fluid in the right pelvis. Uterus and ovaries are grossly normal on this noncontrast exam. No aggressive osseous lesion.  IMPRESSION: 1. No nephrolithiasis or obstructive uropathy. 2. No clear evidence of ureterolithiasis. 3. Normal appendix. 4. Small amount of free fluid in the right pelvis is likely physiologic.   Electronically Signed   By: Genevive Bi M.D.   On: 04/26/2013 17:06     MDM   1. Back pain    ibuprofen    Elson Areas, [place] 04/27/13 1837

## 2013-04-27 NOTE — ED Notes (Signed)
Treated 10 days ago for UTI.  Left flank pain started again yesterday.  Had CT that was Neg for stones.  Started on Cipro yesterday with no improvement in pain.  PMD wanted pt to come have UA done today.

## 2013-04-28 NOTE — ED Provider Notes (Signed)
Medical screening examination/treatment/procedure(s) were performed by non-physician practitioner and as supervising physician I was immediately available for consultation/collaboration.  EKG Interpretation   None        Courtney F Horton, MD 04/28/13 0102 

## 2013-05-03 ENCOUNTER — Other Ambulatory Visit: Payer: Self-pay | Admitting: Emergency Medicine

## 2013-06-03 ENCOUNTER — Ambulatory Visit: Payer: Self-pay

## 2013-06-10 ENCOUNTER — Ambulatory Visit: Payer: Self-pay

## 2013-06-24 DIAGNOSIS — T7840XA Allergy, unspecified, initial encounter: Secondary | ICD-10-CM | POA: Insufficient documentation

## 2013-06-25 ENCOUNTER — Ambulatory Visit: Payer: Self-pay | Admitting: Emergency Medicine

## 2013-06-30 ENCOUNTER — Encounter: Payer: Self-pay | Admitting: Emergency Medicine

## 2013-06-30 ENCOUNTER — Ambulatory Visit (INDEPENDENT_AMBULATORY_CARE_PROVIDER_SITE_OTHER): Payer: BC Managed Care – PPO | Admitting: Emergency Medicine

## 2013-06-30 VITALS — BP 100/68 | HR 74 | Temp 98.2°F | Resp 18 | Ht 65.0 in | Wt 152.0 lb

## 2013-06-30 DIAGNOSIS — M5432 Sciatica, left side: Secondary | ICD-10-CM

## 2013-06-30 DIAGNOSIS — M5430 Sciatica, unspecified side: Secondary | ICD-10-CM

## 2013-06-30 DIAGNOSIS — L708 Other acne: Secondary | ICD-10-CM

## 2013-06-30 DIAGNOSIS — L709 Acne, unspecified: Secondary | ICD-10-CM

## 2013-06-30 MED ORDER — ADAPALENE 0.1 % EX CREA: TOPICAL_CREAM | CUTANEOUS | Status: DC

## 2013-06-30 MED ORDER — PREDNISONE 10 MG PO TABS
ORAL_TABLET | ORAL | Status: DC
Start: 2013-06-30 — End: 2013-09-14

## 2013-06-30 NOTE — Progress Notes (Signed)
Subjective:    Patient ID: Jane Harper, female    DOB: 1981-02-25, 33 y.o.   MRN: [id number]  HPI Comments: 33 yo female with left low back pain that occasionally radiates down left leg. She notes pain is on/off, the only trigger is stress. She denies trauma/ strain. She can occasionally get relief with stretching. She notes intense burning pain when occurs. She denies relief with OTC.  She also has noticed increase facial acne with increased stress. She has tried multiple OTC cleansers without relief. She notes it is red/ swollen and painful acne mostly over chin/ cheeks. She is requesting refill differin and notes it worked well in past.  Hip Pain      Medication List       This list is accurate as of: 06/30/13 11:59 PM.  Always use your most recent med list.               adapalene 0.1 % cream  Commonly known as:  DIFFERIN  Apply to affected area nightly     albuterol 108 (90 BASE) MCG/ACT inhaler  Commonly known as:  PROAIR HFA  Inhale 2 puffs into the lungs every 6 (six) hours as needed for wheezing.     amphetamine-dextroamphetamine 10 MG 24 hr capsule  Commonly known as:  ADDERALL XR  Take 1 capsule (10 mg total) by mouth every morning.     beclomethasone 40 MCG/ACT inhaler  Commonly known as:  QVAR  Inhale 2 puffs into the lungs 2 (two) times daily.     predniSONE 10 MG tablet  Commonly known as:  DELTASONE  1 po TID x 3 days, 1 PO BID x 3 days, 1 po QD x 5 days     valACYclovir 1000 MG tablet  Commonly known as:  VALTREX  Take 1 tablet (1,000 mg total) by mouth daily.       ALLERGIES No Known Allergies  Past Medical History  Diagnosis Date  . Herpes genitalia 2009  . ADHD (attention deficit hyperactivity disorder)   . Allergy       Review of Systems  Musculoskeletal: Positive for back pain.  Skin: Positive for rash.  All other systems reviewed and are negative.   BP 100/68  Pulse 74  Temp(Src) 98.2 F (36.8 C) (Temporal)  Resp 18  Ht 5'  5" (1.651 m)  Wt 152 lb (68.947 kg)  BMI 25.29 kg/m2  LMP 06/29/2013     Objective:   Physical Exam  Nursing note and vitals reviewed. Constitutional: She is oriented to person, place, and time. She appears well-developed and well-nourished.  HENT:  Head: Normocephalic and atraumatic.  Right Ear: External ear normal.  Left Ear: External ear normal.  Nose: Nose normal.  Mouth/Throat: Oropharynx is clear and moist.  Eyes: Conjunctivae and EOM are normal.  Neck: Normal range of motion.  Cardiovascular: Normal rate, regular rhythm, normal heart sounds and intact distal pulses.   Pulmonary/Chest: Effort normal and breath sounds normal.  Musculoskeletal: Normal range of motion.  Mild discomfort with SLR Left  Lymphadenopathy:    She has no cervical adenopathy.  Neurological: She is alert and oriented to person, place, and time.  Skin: Skin is warm and dry.  Across cheeks/ jaw line 2-3 mm elevated irritated areas  Psychiatric: She has a normal mood and affect. Judgment normal.          Assessment & Plan:  1. Probable sciatica left- Heat, stretch, ice BID, Pred DP 10 mg AD  2. Acne- REfill Differin AD

## 2013-06-30 NOTE — Patient Instructions (Signed)

## 2013-09-13 ENCOUNTER — Encounter: Payer: Self-pay | Admitting: Emergency Medicine

## 2013-09-13 DIAGNOSIS — Z0000 Encounter for general adult medical examination without abnormal findings: Secondary | ICD-10-CM

## 2013-09-14 ENCOUNTER — Ambulatory Visit (INDEPENDENT_AMBULATORY_CARE_PROVIDER_SITE_OTHER): Payer: BC Managed Care – PPO | Admitting: Emergency Medicine

## 2013-09-14 ENCOUNTER — Encounter: Payer: Self-pay | Admitting: Emergency Medicine

## 2013-09-14 VITALS — BP 106/68 | HR 94 | Temp 98.2°F | Resp 16 | Ht 65.0 in | Wt 147.0 lb

## 2013-09-14 DIAGNOSIS — R197 Diarrhea, unspecified: Secondary | ICD-10-CM

## 2013-09-14 DIAGNOSIS — R112 Nausea with vomiting, unspecified: Secondary | ICD-10-CM

## 2013-09-14 MED ORDER — HYOSCYAMINE SULFATE 0.125 MG PO TABS: 0.1250 mg | ORAL_TABLET | ORAL | Status: AC | PRN

## 2013-09-14 MED ORDER — ONDANSETRON 8 MG PO TBDP: 8.0000 mg | ORAL_TABLET | Freq: Three times a day (TID) | ORAL | Status: AC | PRN

## 2013-09-14 NOTE — Patient Instructions (Signed)
Dehydration, Adult °Dehydration means your body does not have as much fluid as it needs. Your kidneys, brain, and heart will not work properly without the right amount of fluids and salt.  °HOME CARE °· Ask your doctor how to replace body fluid losses (rehydrate). °· Drink enough fluids to keep your pee (urine) clear or pale yellow. °· Drink small amounts of fluids often if you feel sick to your stomach (nauseous) or throw up (vomit). °· Eat like you normally do. °· Avoid: °· Foods or drinks high in sugar. °· Bubbly (carbonated) drinks. °· Juice. °· Very hot or cold fluids. °· Drinks with caffeine. °· Fatty, greasy foods. °· Alcohol. °· Tobacco. °· Eating too much. °· Gelatin desserts. °· Wash your hands to avoid spreading germs (bacteria, viruses). °· Only take medicine as told by your doctor. °· Keep all doctor visits as told. °GET HELP RIGHT AWAY IF:  °· You cannot drink something without throwing up. °· You get worse even with treatment. °· Your vomit has blood in it or looks greenish. °· Your poop (stool) has blood in it or looks black and tarry. °· You have not peed in 6 to 8 hours. °· You pee a small amount of very dark pee. °· You have a fever. °· You pass out (faint). °· You have belly (abdominal) pain that gets worse or stays in one spot (localizes). °· You have a rash, stiff neck, or bad headache. °· You get easily annoyed, sleepy, or are hard to wake up. °· You feel weak, dizzy, or very thirsty. °MAKE SURE YOU:  °· Understand these instructions. °· Will watch your condition. °· Will get help right away if you are not doing well or get worse. °Document Released: 03/09/2009 Document Revised: 08/05/2011 Document Reviewed: 12/31/2010 °ExitCare® Patient Information ©2014 [employer]. ° °Viral Gastroenteritis °Viral gastroenteritis is also known as stomach flu. This condition affects the stomach and intestinal tract. It can cause sudden diarrhea and vomiting. The illness typically lasts 3 to 8 days. Most  people develop an immune response that eventually gets rid of the virus. While this natural response develops, the virus can make you quite ill. °CAUSES  °Many different viruses can cause gastroenteritis, such as rotavirus or noroviruses. You can catch one of these viruses by consuming contaminated food or water. You may also catch a virus by sharing utensils or other personal items with an infected person or by touching a contaminated surface. °SYMPTOMS  °The most common symptoms are diarrhea and vomiting. These problems can cause a severe loss of body fluids (dehydration) and a body salt (electrolyte) imbalance. Other symptoms may include: °· Fever. °· Headache. °· Fatigue. °· Abdominal pain. °DIAGNOSIS  °Your caregiver can usually diagnose viral gastroenteritis based on your symptoms and a physical exam. A stool sample may also be taken to test for the presence of viruses or other infections. °TREATMENT  °This illness typically goes away on its own. Treatments are aimed at rehydration. The most serious cases of viral gastroenteritis involve vomiting so severely that you are not able to keep fluids down. In these cases, fluids must be given through an intravenous line (IV). °HOME CARE INSTRUCTIONS  °· Drink enough fluids to keep your urine clear or pale yellow. Drink small amounts of fluids frequently and increase the amounts as tolerated. °· Ask your caregiver for specific rehydration instructions. °· Avoid: °· Foods high in sugar. °· Alcohol. °· Carbonated drinks. °· Tobacco. °· Juice. °· Caffeine drinks. °· Extremely hot   or cold fluids. °· Fatty, greasy foods. °· Too much intake of anything at one time. °· Dairy products until 24 to 48 hours after diarrhea stops. °· You may consume probiotics. Probiotics are active cultures of beneficial bacteria. They may lessen the amount and number of diarrheal stools in adults. Probiotics can be found in yogurt with active cultures and in supplements. °· Wash your hands  well to avoid spreading the virus. °· Only take over-the-counter or prescription medicines for pain, discomfort, or fever as directed by your caregiver. Do not give aspirin to children. Antidiarrheal medicines are not recommended. °· Ask your caregiver if you should continue to take your regular prescribed and over-the-counter medicines. °· Keep all follow-up appointments as directed by your caregiver. °SEEK IMMEDIATE MEDICAL CARE IF:  °· You are unable to keep fluids down. °· You do not urinate at least once every 6 to 8 hours. °· You develop shortness of breath. °· You notice blood in your stool or vomit. This may look like coffee grounds. °· You have abdominal pain that increases or is concentrated in one small area (localized). °· You have persistent vomiting or diarrhea. °· You have a fever. °· The patient is a child younger than 3 months, and he or she has a fever. °· The patient is a child older than 3 months, and he or she has a fever and persistent symptoms. °· The patient is a child older than 3 months, and he or she has a fever and symptoms suddenly get worse. °· The patient is a baby, and he or she has no tears when crying. °MAKE SURE YOU:  °· Understand these instructions. °· Will watch your condition. °· Will get help right away if you are not doing well or get worse. °Document Released: 05/13/2005 Document Revised: 08/05/2011 Document Reviewed: 02/27/2011 °ExitCare® Patient Information ©2014 [employer]. ° °

## 2013-09-14 NOTE — Progress Notes (Signed)
   Subjective:    Patient ID: Jane Harper, female    DOB: 05/27/1980, 33 y.o.   MRN: [id number]  HPI Comments: 33 yo female with new onset of vomiting/ diarrhea/ fever/ chills x 1 day. She has had Imodium without relief. She notes she has not been able to keep liquids/ food. She notes mild fatigue but denies remaining dehydration symptoms.   Emesis  Associated symptoms include arthralgias, diarrhea and myalgias.  Diarrhea  Associated symptoms include arthralgias, myalgias and vomiting.      Review of Systems  Constitutional: Positive for fatigue.  Gastrointestinal: Positive for vomiting and diarrhea.  Musculoskeletal: Positive for arthralgias and myalgias.  All other systems reviewed and are negative. BP 106/68  Pulse 94  Temp(Src) 98.2 F (36.8 C) (Temporal)  Resp 16  Ht 5\' 5"  (1.651 m)  Wt 147 lb (66.679 kg)  BMI 24.46 kg/m2  LMP 08/31/2013      Objective:   Physical Exam  Nursing note and vitals reviewed. Constitutional: She is oriented to person, place, and time. She appears well-developed and well-nourished.  HENT:  Head: Normocephalic and atraumatic.  Mouth/Throat: Oropharynx is clear and moist.  Eyes: Conjunctivae are normal.  Neck: Normal range of motion.  Cardiovascular: Normal rate, regular rhythm, normal heart sounds and intact distal pulses.   Pulmonary/Chest: Effort normal and breath sounds normal.  Abdominal: Soft. Bowel sounds are normal. She exhibits no distension and no mass. There is tenderness. There is no rebound and no guarding.  Minimal diffuse  Musculoskeletal: Normal range of motion.  Neurological: She is alert and oriented to person, place, and time.  Skin: Skin is warm and dry.  Psychiatric: She has a normal mood and affect. Judgment normal.          Assessment & Plan:  1. N/V/D-  Probably Viral gastritis- Restora 1 QD, Bland diet x 1 week, push fluids, Check labs, if any increased symptoms ER. Zofran ODT 8 mg AD, Hyoscyamine 0.125 mg  AD/ PRN. OOW x 2 days  2. Chronic back pain left- advised f/u for re-eval once viral episode completed, may need ref ortho with neg CT scan of abdomen

## 2013-09-15 ENCOUNTER — Telehealth: Payer: Self-pay

## 2013-09-15 ENCOUNTER — Ambulatory Visit (INDEPENDENT_AMBULATORY_CARE_PROVIDER_SITE_OTHER): Payer: BC Managed Care – PPO | Admitting: Internal Medicine

## 2013-09-15 ENCOUNTER — Encounter: Payer: Self-pay | Admitting: Internal Medicine

## 2013-09-15 VITALS — BP 118/78 | HR 80 | Temp 98.1°F | Wt 149.8 lb

## 2013-09-15 DIAGNOSIS — M549 Dorsalgia, unspecified: Secondary | ICD-10-CM

## 2013-09-15 MED ORDER — PREDNISONE 10 MG PO TABS: ORAL_TABLET | ORAL | Status: AC

## 2013-09-15 NOTE — Patient Instructions (Signed)
Take prednisone  as prescribed  If the pain continue or return after the treatment please let us know, will refer you to a specialist, likely you will need more x-rays

## 2013-09-15 NOTE — Progress Notes (Signed)
Pre visit review using our clinic review tool, if applicable. No additional management support is needed unless otherwise documented below in the visit note. 

## 2013-09-15 NOTE — Telephone Encounter (Signed)
Patient called to ask if she could return to work since she did not have a fever. Advised as long as she felt fine she could return to work.

## 2013-09-15 NOTE — Progress Notes (Signed)
Subjective:    Patient ID: Jane Harper, female    DOB: 03/12/1981, 33 y.o.   MRN: [id number]  DOS:  09/15/2013 Type of  visit: Acute visit Having pain the left lower back on and off since December. Was initially seen at the ER, CT of the abdomen and pelvis was negative. Since then she has taken Tylenol and Advil without relief. Pain is worse when she lays down on the left, or if she bends or sits in certain ways. No radiation but occasionally feels tingling on her left toes .   ROS Denies any rash on the area, and no injury, no abdominal pain.   Past Medical History  Diagnosis Date  . Herpes genitalia 2009  . ADHD (attention deficit hyperactivity disorder)   . Allergy     Past Surgical History  Procedure Laterality Date  . Nevus excision  08-10-2012    Dysplastic nevus - left upper inner thigh    History   Social History  . Marital Status: Divorced    Spouse Name: N/A    Number of Children: N/A  . Years of Education: N/A   Occupational History  . Not on file.   Social History Main Topics  . Smoking status: Never Smoker   . Smokeless tobacco: Not on file  . Alcohol Use: No  . Drug Use: No  . Sexual Activity: Yes   Other Topics Concern  . Not on file   Social History Narrative  . No narrative on file        Medication List       This list is accurate as of: 09/15/13  6:40 PM.  Always use your most recent med list.               adapalene 0.1 % cream  Commonly known as:  DIFFERIN  Apply to affected area nightly     albuterol 108 (90 BASE) MCG/ACT inhaler  Commonly known as:  PROAIR HFA  Inhale 2 puffs into the lungs every 6 (six) hours as needed for wheezing.     amphetamine-dextroamphetamine 10 MG 24 hr capsule  Commonly known as:  ADDERALL XR  Take 1 capsule (10 mg total) by mouth every morning.     beclomethasone 40 MCG/ACT inhaler  Commonly known as:  QVAR  Inhale 2 puffs into the lungs 2 (two) times daily.     hyoscyamine 0.125 MG  tablet  Commonly known as:  LEVSIN, ANASPAZ  Take 1 tablet (0.125 mg total) by mouth every 4 (four) hours as needed.     ondansetron 8 MG disintegrating tablet  Commonly known as:  ZOFRAN ODT  Take 1 tablet (8 mg total) by mouth every 8 (eight) hours as needed for nausea or vomiting.     predniSONE 10 MG tablet  Commonly known as:  DELTASONE  3 tabs x 3 days, 2 tabs x 3 days, 1 tab x 3 days     valACYclovir 1000 MG tablet  Commonly known as:  VALTREX  Take 1 tablet (1,000 mg total) by mouth daily.           Objective:   Physical Exam  Musculoskeletal:       Back:   BP 118/78  Pulse 80  Temp(Src) 98.1 F (36.7 C) (Oral)  Wt 149 lb 12.8 oz (67.949 kg)  SpO2 97%  LMP 08/31/2013  General -- alert, well-developed, NAD.   Abdomen-- Not distended, good bowel sounds,soft, non-tender. Extremities-- no pretibial edema bilaterally ;  slightly tender at the left trochanteric bursa? (that Is not were  the perceived pain is) Neurologic--  alert & oriented X3. Speech normal, gait normal, strength normal in all extremities.  DTRs symmetric  Psych-- Cognition and judgment appear intact. Cooperative with normal attention span and concentration. No anxious or depressed appearing.         Assessment & Plan:  Back pain, Pain located at the posterior iliac crest, she seems to have a mechanical pain, CT abdomen and pelvis 12- 2014 negative. Etiology unclear, plan: Anti-inflammatory treatment with prednisone If no improvement will recommend further eval by  Ortho or sports med, XRs? (declined UPT before rx, states husband had a vasectomy)

## 2014-02-25 ENCOUNTER — Ambulatory Visit (INDEPENDENT_AMBULATORY_CARE_PROVIDER_SITE_OTHER): Payer: BC Managed Care – PPO | Admitting: Medical

## 2014-02-25 ENCOUNTER — Ambulatory Visit (HOSPITAL_BASED_OUTPATIENT_CLINIC_OR_DEPARTMENT_OTHER)
Admission: RE | Admit: 2014-02-25 | Discharge: 2014-02-25 | Disposition: A | Discharge status: Home / Self Care | Payer: BC Managed Care – PPO | Source: Ambulatory Visit | Attending: Medical | Admitting: Medical

## 2014-02-25 ENCOUNTER — Other Ambulatory Visit: Payer: Self-pay

## 2014-02-25 ENCOUNTER — Encounter: Payer: Self-pay | Admitting: Medical

## 2014-02-25 VITALS — BP 117/81 | HR 68 | Temp 98.2°F | Ht 65.75 in | Wt 148.8 lb

## 2014-02-25 DIAGNOSIS — M79645 Pain in left finger(s): Secondary | ICD-10-CM | POA: Diagnosis not present

## 2014-02-25 DIAGNOSIS — H6690 Otitis media, unspecified, unspecified ear: Secondary | ICD-10-CM | POA: Insufficient documentation

## 2014-02-25 DIAGNOSIS — J45909 Unspecified asthma, uncomplicated: Secondary | ICD-10-CM | POA: Insufficient documentation

## 2014-02-25 DIAGNOSIS — H6501 Acute serous otitis media, right ear: Secondary | ICD-10-CM

## 2014-02-25 DIAGNOSIS — J302 Other seasonal allergic rhinitis: Secondary | ICD-10-CM

## 2014-02-25 DIAGNOSIS — J9801 Acute bronchospasm: Secondary | ICD-10-CM

## 2014-02-25 DIAGNOSIS — T7840XA Allergy, unspecified, initial encounter: Secondary | ICD-10-CM

## 2014-02-25 DIAGNOSIS — L709 Acne, unspecified: Secondary | ICD-10-CM

## 2014-02-25 DIAGNOSIS — J4530 Mild persistent asthma, uncomplicated: Secondary | ICD-10-CM

## 2014-02-25 MED ORDER — FLUTICASONE PROPIONATE 50 MCG/ACT NA SUSP: 2.0000 | Freq: Every day | NASAL | Status: AC

## 2014-02-25 MED ORDER — ADAPALENE 0.1 % EX CREA: TOPICAL_CREAM | CUTANEOUS | Status: AC

## 2014-02-25 MED ORDER — CEFDINIR 300 MG PO CAPS: 300.0000 mg | ORAL_CAPSULE | Freq: Two times a day (BID) | ORAL | Status: AC

## 2014-02-25 MED ORDER — DICLOFENAC SODIUM 75 MG PO TBEC: 75.0000 mg | DELAYED_RELEASE_TABLET | Freq: Two times a day (BID) | ORAL | Status: AC

## 2014-02-25 MED ORDER — ALBUTEROL SULFATE HFA 108 (90 BASE) MCG/ACT IN AERS: 2.0000 | INHALATION_SPRAY | Freq: Four times a day (QID) | RESPIRATORY_TRACT | Status: DC | PRN

## 2014-02-25 MED ORDER — BECLOMETHASONE DIPROPIONATE 40 MCG/ACT IN AERS: 2.0000 | INHALATION_SPRAY | Freq: Two times a day (BID) | RESPIRATORY_TRACT | Status: DC

## 2014-02-25 NOTE — Progress Notes (Signed)
   Subjective:    Patient ID: Jane Harper, female    DOB: 11/06/1980, 33 y.o.   MRN: [id number]  HPI  Lt 2nd digit pain medial aspect of mcp for one trauma. No trauma and no injury. Small lump when fist clenched. Pain increased on fist clenching. Sometimes just aches.  Also pain rt ear pain for one day. Mild congestion. Some sneezing. No fever or chills. Minimal low grade ear pain.  Also needs refill of her asthma meds. Pt not wheezing recently.  Acne history. She uses differin.  LMP- 3 days ago.       Review of Systems  Constitutional: Negative for fever, chills and fatigue.  HENT: Positive for congestion and ear pain. Negative for mouth sores, nosebleeds, postnasal drip, rhinorrhea, sinus pressure, sneezing, sore throat, tinnitus and voice change.        Ear pain and congestion occurred after attending brother wedding and in the rain.  Respiratory: Negative for cough, choking, chest tightness, shortness of breath and wheezing.   Cardiovascular: Negative for chest pain and palpitations.  Genitourinary: Negative.   Musculoskeletal:       Lt second digit pain. Mcp joint.  Skin: Negative for color change, pallor, rash and wound.       Hx of acne and request differin refill.  Neurological: Negative.   Hematological: Negative for adenopathy. Does not bruise/bleed easily.  Psychiatric/Behavioral: Negative.        Objective:   Physical Exam  General  Mental Status - Alert. General Appearance - Well groomed. Not in acute distress.  Skin Rashes- No Rashes. No obvious acne on her face.  HEENT Head- Normal. Ear Auditory Canal - Left- Normal. Right - Mild dull rt tm.Tympanic Membrane- Left- Normal. Right- Normal. Eye Sclera/Conjunctiva- Left- Normal. Right- Normal. Nose & Sinuses Nasal Mucosa- Left-  Boggy + Congested. Right-  Boggy + Congested. Mouth & Throat Lips: Upper Lip- Normal: no dryness, cracking, pallor, cyanosis, or vesicular eruption. Lower Lip-Normal: no  dryness, cracking, pallor, cyanosis or vesicular eruption. Buccal Mucosa- Bilateral- No Aphthous ulcers. Oropharynx- No Discharge or Erythema. Tonsils: Characteristics- Bilateral- No Erythema or Congestion. Size/Enlargement- Bilateral- No enlargement. Discharge- bilateral-None.  Neck Neck- Supple. No Masses.   Chest and Lung Exam Auscultation: Breath Sounds:-Normal, clear even and unlabored.  Cardiovascular Auscultation:Rythm- Regular, rate and rythm Murmurs & Other Heart Sounds:Ausculatation of the heart reveal- No Murmurs.  Lymphatic Head & Neck General Head & Neck Lymphatics: Bilateral: Description- No Localized lymphadenopathy.        Assessment & Plan:

## 2014-02-25 NOTE — Assessment & Plan Note (Signed)
Controlled today. Refilled her qvar and albuterol at pt request.

## 2014-02-25 NOTE — Assessment & Plan Note (Signed)
Rx diclofenac. Xray of finger and offered referral to ortho. Pt declined today.

## 2014-02-25 NOTE — Patient Instructions (Signed)
For your finger pain, etiology undetermined. I want to get xray of digit and I rx'd diclofenac for pain. We will let you know results and may refer you to orthopedist if your are agreeable to this.  For your recent nasal congestion, this may be allergy related. I rx'd fluticasone nasal spray. You may have early rt om so I rx'd cefdinir if your ear pain persists or worsens start antibitoic.  I refilled your inhalers and your acne medicine.  Follow up in 1 wk any persisting symptoms or as needed.

## 2014-02-25 NOTE — Assessment & Plan Note (Signed)
Refilled her differen.

## 2014-02-25 NOTE — Assessment & Plan Note (Signed)
Rx cefdinir if ear pain does not improve or if worsens over weekend. She will start fluticasone today for congestion.

## 2014-02-25 NOTE — Assessment & Plan Note (Signed)
Rx fluticsone.

## 2014-03-09 ENCOUNTER — Telehealth: Payer: Self-pay | Admitting: Family Medicine

## 2014-03-09 DIAGNOSIS — J9801 Acute bronchospasm: Secondary | ICD-10-CM

## 2014-03-09 DIAGNOSIS — J302 Other seasonal allergic rhinitis: Secondary | ICD-10-CM

## 2014-03-09 MED ORDER — BECLOMETHASONE DIPROPIONATE 40 MCG/ACT IN AERS: 2.0000 | INHALATION_SPRAY | Freq: Two times a day (BID) | RESPIRATORY_TRACT | Status: AC

## 2014-03-09 MED ORDER — ALBUTEROL SULFATE HFA 108 (90 BASE) MCG/ACT IN AERS: 2.0000 | INHALATION_SPRAY | Freq: Four times a day (QID) | RESPIRATORY_TRACT | Status: AC | PRN

## 2014-03-09 NOTE — Telephone Encounter (Signed)
Ok for 1 of each inhaler- last refill w/o OV

## 2014-03-09 NOTE — Telephone Encounter (Signed)
Caller name: Raneisha Relation to pt: Call back number: [phone] Pharmacy: [facility] [phone]  [address], [place]  Reason for call:  Pt is needing both of her inhalers sent to the pharmacy listed above albuterol (PROAIR HFA) 108 (90 BASE) MCG/ACT inhaler  beclomethasone (QVAR) 40 MCG/ACT inhaler

## 2014-03-09 NOTE — Telephone Encounter (Signed)
Please advise, pt has not see you since 03/2012.

## 2014-03-09 NOTE — Telephone Encounter (Signed)
Med filled.  

## 2014-03-09 NOTE — Telephone Encounter (Signed)
A user error has taken place.

## 2014-09-14 ENCOUNTER — Encounter: Payer: Self-pay | Admitting: Emergency Medicine

## 2015-11-26 IMAGING — CR DG FINGER INDEX 2+V*L*
3 series · 3 of 3 positions shown · non-contrast
Comparison: None.

CLINICAL DATA: Intermittent pain at the PIP joint level. No history
of trauma

EXAM:
LEFT SECOND FINGER 2+V

[x finger pa left]
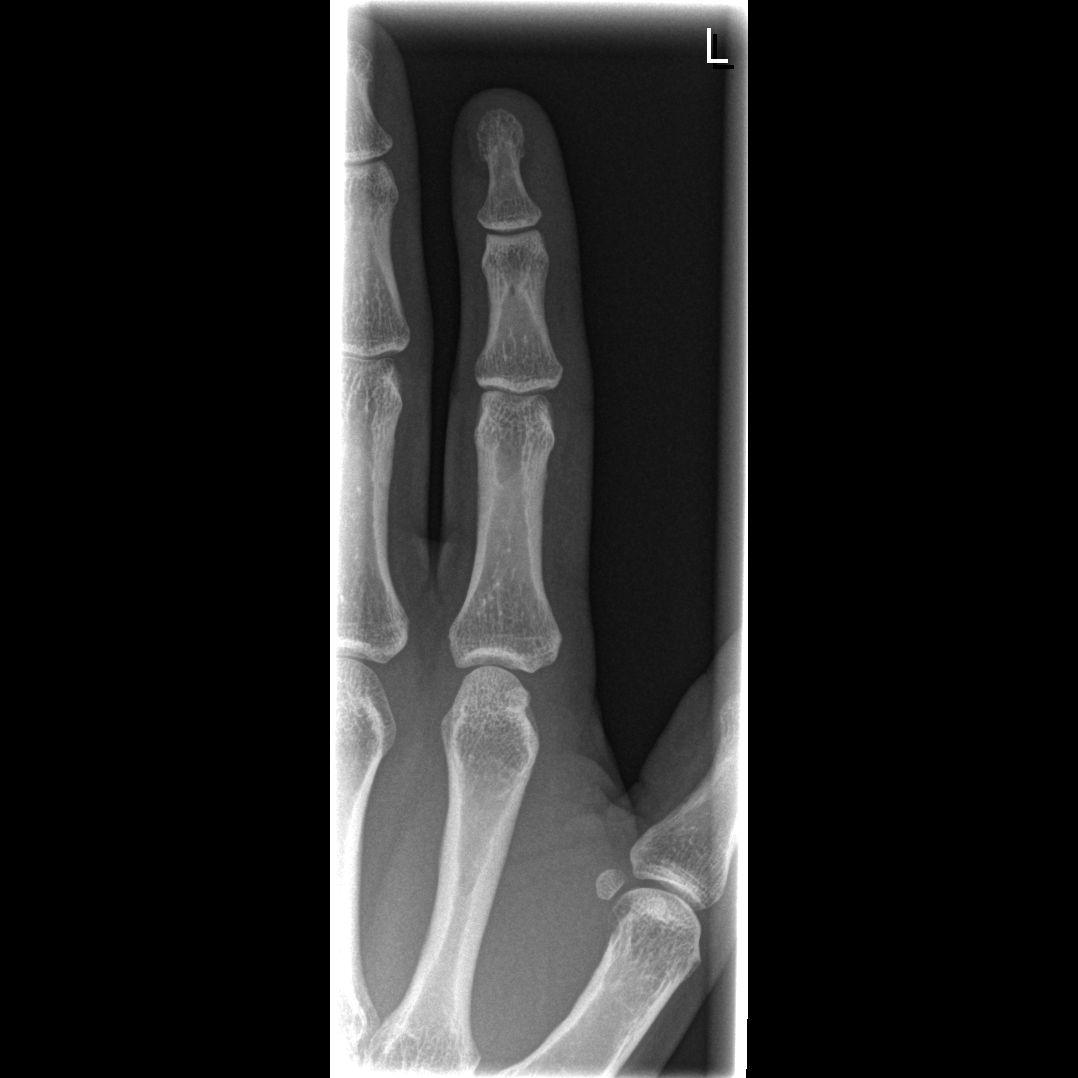

[x finger obl. left]
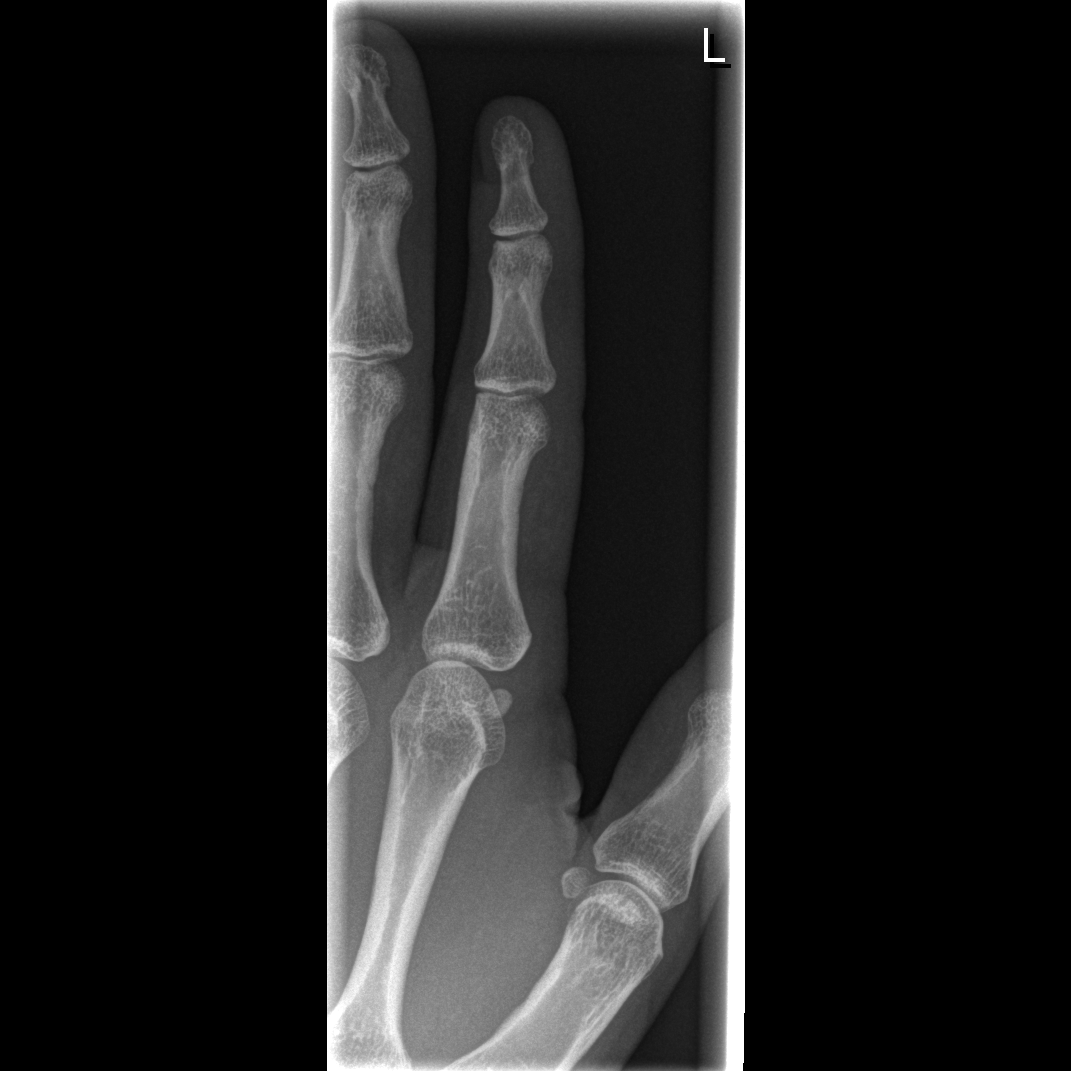

[x finger lateral left]
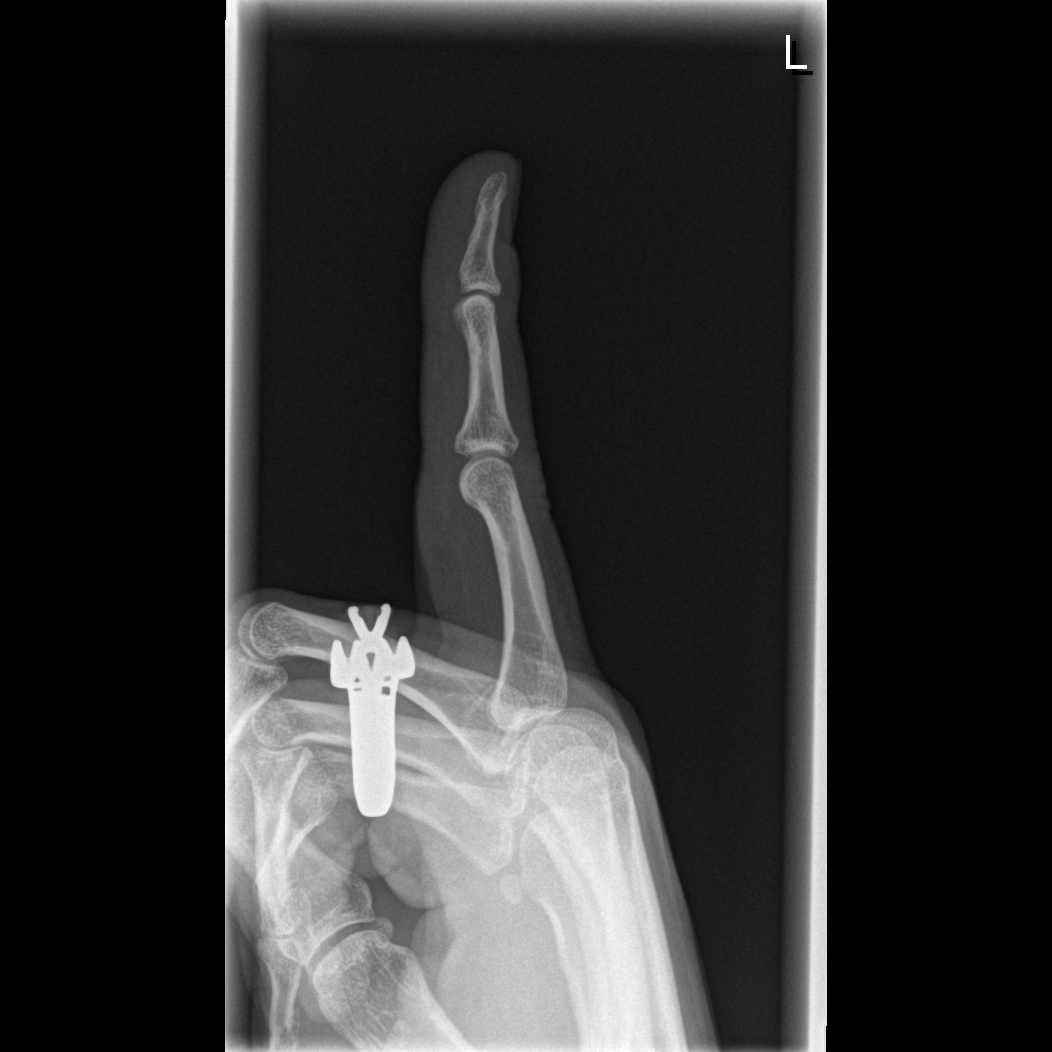

[3 of 3 positions shown; findings below may reference images not displayed]

FINDINGS: Frontal, oblique, and lateral views were obtained. No fracture or
dislocation. Joint spaces appear intact. No erosive change.
IMPRESSION: No abnormality noted.

## 2019-03-24 ENCOUNTER — Other Ambulatory Visit: Payer: Self-pay | Admitting: Obstetrics and Gynecology

## 2019-03-24 DIAGNOSIS — E049 Nontoxic goiter, unspecified: Secondary | ICD-10-CM
